# Patient Record
Sex: Female | Born: 1950 | Race: White | Hispanic: No | Marital: Married | Smoking: Former smoker
Health system: Southern US, Community
[De-identification: ages and names within clinical notes are randomized; demographics above are authoritative.]

## PROBLEM LIST (undated history)

## (undated) DIAGNOSIS — I639 Cerebral infarction, unspecified: Secondary | ICD-10-CM

## (undated) DIAGNOSIS — I10 Essential (primary) hypertension: Secondary | ICD-10-CM

## (undated) HISTORY — DX: Cerebral infarction, unspecified: I63.9

## (undated) HISTORY — PX: TONSILLECTOMY: SUR1361

## (undated) HISTORY — PX: BREAST BIOPSY: SHX20

## (undated) HISTORY — DX: Essential (primary) hypertension: I10

## (undated) HISTORY — PX: BREAST EXCISIONAL BIOPSY: SUR124

---

## 1996-07-19 HISTORY — PX: THYROIDECTOMY: SHX17

## 1998-09-11 ENCOUNTER — Other Ambulatory Visit: Admission: RE | Admit: 1998-09-11 | Discharge: 1998-09-11 | Payer: Self-pay | Admitting: *Deleted

## 1999-05-14 ENCOUNTER — Encounter: Payer: Self-pay | Admitting: *Deleted

## 1999-05-14 ENCOUNTER — Encounter: Admission: RE | Admit: 1999-05-14 | Discharge: 1999-05-14 | Payer: Self-pay | Admitting: *Deleted

## 1999-11-11 ENCOUNTER — Other Ambulatory Visit: Admission: RE | Admit: 1999-11-11 | Discharge: 1999-11-11 | Payer: Self-pay | Admitting: *Deleted

## 1999-11-26 ENCOUNTER — Encounter: Payer: Self-pay | Admitting: *Deleted

## 1999-11-26 ENCOUNTER — Encounter: Admission: RE | Admit: 1999-11-26 | Discharge: 1999-11-26 | Payer: Self-pay | Admitting: *Deleted

## 2000-11-14 ENCOUNTER — Other Ambulatory Visit: Admission: RE | Admit: 2000-11-14 | Discharge: 2000-11-14 | Payer: Self-pay | Admitting: *Deleted

## 2000-11-17 ENCOUNTER — Encounter: Payer: Self-pay | Admitting: *Deleted

## 2000-11-17 ENCOUNTER — Encounter: Admission: RE | Admit: 2000-11-17 | Discharge: 2000-11-17 | Payer: Self-pay | Admitting: *Deleted

## 2001-11-14 ENCOUNTER — Other Ambulatory Visit: Admission: RE | Admit: 2001-11-14 | Discharge: 2001-11-14 | Payer: Self-pay | Admitting: *Deleted

## 2001-11-20 ENCOUNTER — Encounter: Payer: Self-pay | Admitting: Endocrinology

## 2001-11-20 ENCOUNTER — Encounter: Admission: RE | Admit: 2001-11-20 | Discharge: 2001-11-20 | Payer: Self-pay | Admitting: Endocrinology

## 2002-11-27 ENCOUNTER — Encounter: Payer: Self-pay | Admitting: Obstetrics and Gynecology

## 2002-11-27 ENCOUNTER — Encounter: Admission: RE | Admit: 2002-11-27 | Discharge: 2002-11-27 | Payer: Self-pay | Admitting: Obstetrics and Gynecology

## 2002-11-27 ENCOUNTER — Other Ambulatory Visit: Admission: RE | Admit: 2002-11-27 | Discharge: 2002-11-27 | Payer: Self-pay | Admitting: Obstetrics and Gynecology

## 2003-12-02 ENCOUNTER — Other Ambulatory Visit: Admission: RE | Admit: 2003-12-02 | Discharge: 2003-12-02 | Payer: Self-pay | Admitting: Obstetrics and Gynecology

## 2003-12-11 ENCOUNTER — Encounter: Admission: RE | Admit: 2003-12-11 | Discharge: 2003-12-11 | Payer: Self-pay | Admitting: Obstetrics and Gynecology

## 2003-12-11 ENCOUNTER — Encounter (INDEPENDENT_AMBULATORY_CARE_PROVIDER_SITE_OTHER): Payer: Self-pay | Admitting: Specialist

## 2003-12-25 ENCOUNTER — Encounter (HOSPITAL_COMMUNITY): Admission: RE | Admit: 2003-12-25 | Discharge: 2004-03-24 | Payer: Self-pay | Admitting: Obstetrics and Gynecology

## 2004-12-11 ENCOUNTER — Encounter: Admission: RE | Admit: 2004-12-11 | Discharge: 2004-12-11 | Payer: Self-pay | Admitting: Obstetrics and Gynecology

## 2005-12-15 ENCOUNTER — Encounter: Admission: RE | Admit: 2005-12-15 | Discharge: 2005-12-15 | Payer: Self-pay | Admitting: Obstetrics and Gynecology

## 2007-02-06 ENCOUNTER — Encounter: Admission: RE | Admit: 2007-02-06 | Discharge: 2007-02-06 | Payer: Self-pay | Admitting: Obstetrics and Gynecology

## 2007-02-14 ENCOUNTER — Encounter: Admission: RE | Admit: 2007-02-14 | Discharge: 2007-02-14 | Payer: Self-pay | Admitting: Obstetrics and Gynecology

## 2007-05-24 ENCOUNTER — Ambulatory Visit (HOSPITAL_COMMUNITY): Admission: RE | Admit: 2007-05-24 | Discharge: 2007-05-24 | Payer: Self-pay | Admitting: Endocrinology

## 2008-05-01 ENCOUNTER — Encounter: Admission: RE | Admit: 2008-05-01 | Discharge: 2008-05-01 | Payer: Self-pay | Admitting: Obstetrics and Gynecology

## 2009-04-25 ENCOUNTER — Encounter (HOSPITAL_COMMUNITY): Admission: RE | Admit: 2009-04-25 | Discharge: 2009-06-20 | Payer: Self-pay | Admitting: Endocrinology

## 2009-06-16 ENCOUNTER — Encounter: Admission: RE | Admit: 2009-06-16 | Discharge: 2009-06-16 | Payer: Self-pay | Admitting: Obstetrics and Gynecology

## 2009-10-02 ENCOUNTER — Encounter: Admission: RE | Admit: 2009-10-02 | Discharge: 2009-10-02 | Payer: Self-pay | Admitting: Endocrinology

## 2010-08-09 ENCOUNTER — Encounter: Payer: Self-pay | Admitting: Obstetrics and Gynecology

## 2010-09-17 ENCOUNTER — Other Ambulatory Visit: Payer: Self-pay | Admitting: Obstetrics & Gynecology

## 2010-09-17 DIAGNOSIS — Z09 Encounter for follow-up examination after completed treatment for conditions other than malignant neoplasm: Secondary | ICD-10-CM

## 2010-09-24 ENCOUNTER — Other Ambulatory Visit: Payer: Self-pay | Admitting: Obstetrics & Gynecology

## 2010-09-24 ENCOUNTER — Ambulatory Visit
Admission: RE | Admit: 2010-09-24 | Discharge: 2010-09-24 | Disposition: A | Discharge status: Home / Self Care | Payer: BC Managed Care – PPO | Source: Ambulatory Visit | Attending: Obstetrics & Gynecology | Admitting: Obstetrics & Gynecology

## 2010-09-24 DIAGNOSIS — Z09 Encounter for follow-up examination after completed treatment for conditions other than malignant neoplasm: Secondary | ICD-10-CM

## 2011-11-11 ENCOUNTER — Other Ambulatory Visit: Payer: Self-pay | Admitting: Obstetrics & Gynecology

## 2011-11-11 DIAGNOSIS — Z1231 Encounter for screening mammogram for malignant neoplasm of breast: Secondary | ICD-10-CM

## 2011-11-16 ENCOUNTER — Ambulatory Visit
Admission: RE | Admit: 2011-11-16 | Discharge: 2011-11-16 | Disposition: A | Discharge status: Home / Self Care | Payer: BC Managed Care – PPO | Source: Ambulatory Visit | Attending: Obstetrics & Gynecology | Admitting: Obstetrics & Gynecology

## 2011-11-16 DIAGNOSIS — Z1231 Encounter for screening mammogram for malignant neoplasm of breast: Secondary | ICD-10-CM

## 2013-03-28 ENCOUNTER — Other Ambulatory Visit: Payer: Self-pay

## 2013-03-28 DIAGNOSIS — Z1231 Encounter for screening mammogram for malignant neoplasm of breast: Secondary | ICD-10-CM

## 2013-03-29 ENCOUNTER — Ambulatory Visit
Admission: RE | Admit: 2013-03-29 | Discharge: 2013-03-29 | Disposition: A | Discharge status: Home / Self Care | Payer: BC Managed Care – PPO | Source: Ambulatory Visit

## 2013-03-29 DIAGNOSIS — Z1231 Encounter for screening mammogram for malignant neoplasm of breast: Secondary | ICD-10-CM

## 2013-04-02 ENCOUNTER — Other Ambulatory Visit: Payer: Self-pay | Admitting: Endocrinology

## 2013-04-02 DIAGNOSIS — R928 Other abnormal and inconclusive findings on diagnostic imaging of breast: Secondary | ICD-10-CM

## 2013-04-10 ENCOUNTER — Other Ambulatory Visit: Payer: Self-pay | Admitting: Gastroenterology

## 2013-04-17 ENCOUNTER — Ambulatory Visit
Admission: RE | Admit: 2013-04-17 | Discharge: 2013-04-17 | Disposition: A | Discharge status: Home / Self Care | Payer: BC Managed Care – PPO | Source: Ambulatory Visit | Attending: Endocrinology | Admitting: Endocrinology

## 2013-04-17 DIAGNOSIS — R928 Other abnormal and inconclusive findings on diagnostic imaging of breast: Secondary | ICD-10-CM

## 2015-03-03 ENCOUNTER — Other Ambulatory Visit: Payer: Self-pay

## 2015-03-03 DIAGNOSIS — Z1231 Encounter for screening mammogram for malignant neoplasm of breast: Secondary | ICD-10-CM

## 2015-04-17 ENCOUNTER — Ambulatory Visit: Payer: Self-pay

## 2015-05-07 ENCOUNTER — Ambulatory Visit: Payer: Self-pay

## 2015-06-10 ENCOUNTER — Ambulatory Visit
Admission: RE | Admit: 2015-06-10 | Discharge: 2015-06-10 | Disposition: A | Discharge status: Home / Self Care | Payer: PPO | Source: Ambulatory Visit

## 2015-06-10 DIAGNOSIS — Z1231 Encounter for screening mammogram for malignant neoplasm of breast: Secondary | ICD-10-CM

## 2015-08-19 ENCOUNTER — Other Ambulatory Visit (HOSPITAL_COMMUNITY): Payer: Self-pay | Admitting: Endocrinology

## 2015-08-19 DIAGNOSIS — R42 Dizziness and giddiness: Secondary | ICD-10-CM | POA: Diagnosis not present

## 2015-08-19 DIAGNOSIS — G459 Transient cerebral ischemic attack, unspecified: Secondary | ICD-10-CM

## 2015-08-19 DIAGNOSIS — R930 Abnormal findings on diagnostic imaging of skull and head, not elsewhere classified: Secondary | ICD-10-CM | POA: Diagnosis not present

## 2015-08-19 DIAGNOSIS — H539 Unspecified visual disturbance: Secondary | ICD-10-CM | POA: Diagnosis not present

## 2015-08-19 DIAGNOSIS — E890 Postprocedural hypothyroidism: Secondary | ICD-10-CM | POA: Diagnosis not present

## 2015-08-19 DIAGNOSIS — I639 Cerebral infarction, unspecified: Secondary | ICD-10-CM | POA: Diagnosis not present

## 2015-08-19 DIAGNOSIS — I10 Essential (primary) hypertension: Secondary | ICD-10-CM | POA: Diagnosis not present

## 2015-08-19 DIAGNOSIS — R531 Weakness: Secondary | ICD-10-CM | POA: Diagnosis not present

## 2015-08-21 ENCOUNTER — Ambulatory Visit (HOSPITAL_COMMUNITY)
Admission: RE | Admit: 2015-08-21 | Discharge: 2015-08-21 | Disposition: A | Discharge status: Home / Self Care | Payer: PPO | Source: Ambulatory Visit | Attending: Vascular Surgery | Admitting: Vascular Surgery

## 2015-08-21 ENCOUNTER — Other Ambulatory Visit (HOSPITAL_COMMUNITY): Payer: Self-pay | Admitting: Family Medicine

## 2015-08-21 DIAGNOSIS — H539 Unspecified visual disturbance: Secondary | ICD-10-CM

## 2015-08-21 DIAGNOSIS — I6312 Cerebral infarction due to embolism of basilar artery: Secondary | ICD-10-CM | POA: Diagnosis not present

## 2015-08-21 DIAGNOSIS — G459 Transient cerebral ischemic attack, unspecified: Secondary | ICD-10-CM | POA: Insufficient documentation

## 2015-08-21 DIAGNOSIS — I10 Essential (primary) hypertension: Secondary | ICD-10-CM | POA: Insufficient documentation

## 2015-08-21 DIAGNOSIS — Z6822 Body mass index (BMI) 22.0-22.9, adult: Secondary | ICD-10-CM | POA: Diagnosis not present

## 2015-08-21 DIAGNOSIS — R29898 Other symptoms and signs involving the musculoskeletal system: Secondary | ICD-10-CM

## 2015-08-22 ENCOUNTER — Other Ambulatory Visit: Payer: Self-pay

## 2015-08-22 ENCOUNTER — Encounter (INDEPENDENT_AMBULATORY_CARE_PROVIDER_SITE_OTHER): Payer: Self-pay

## 2015-08-22 ENCOUNTER — Ambulatory Visit (HOSPITAL_COMMUNITY): Payer: PPO | Attending: Cardiology

## 2015-08-22 DIAGNOSIS — I517 Cardiomegaly: Secondary | ICD-10-CM | POA: Insufficient documentation

## 2015-08-22 DIAGNOSIS — G459 Transient cerebral ischemic attack, unspecified: Secondary | ICD-10-CM

## 2015-08-22 DIAGNOSIS — I340 Nonrheumatic mitral (valve) insufficiency: Secondary | ICD-10-CM | POA: Diagnosis not present

## 2015-08-22 DIAGNOSIS — I10 Essential (primary) hypertension: Secondary | ICD-10-CM | POA: Diagnosis not present

## 2015-08-22 DIAGNOSIS — I313 Pericardial effusion (noninflammatory): Secondary | ICD-10-CM | POA: Insufficient documentation

## 2015-08-25 DIAGNOSIS — I6349 Cerebral infarction due to embolism of other cerebral artery: Secondary | ICD-10-CM | POA: Diagnosis not present

## 2015-08-25 DIAGNOSIS — H547 Unspecified visual loss: Secondary | ICD-10-CM | POA: Diagnosis not present

## 2015-08-27 DIAGNOSIS — I10 Essential (primary) hypertension: Secondary | ICD-10-CM | POA: Diagnosis not present

## 2015-08-28 DIAGNOSIS — I6349 Cerebral infarction due to embolism of other cerebral artery: Secondary | ICD-10-CM | POA: Diagnosis not present

## 2015-08-28 DIAGNOSIS — H547 Unspecified visual loss: Secondary | ICD-10-CM | POA: Diagnosis not present

## 2015-09-01 ENCOUNTER — Ambulatory Visit (INDEPENDENT_AMBULATORY_CARE_PROVIDER_SITE_OTHER): Payer: PPO | Admitting: Diagnostic Neuroimaging

## 2015-09-01 ENCOUNTER — Encounter: Payer: Self-pay | Admitting: Diagnostic Neuroimaging

## 2015-09-01 VITALS — BP 135/79 | HR 74 | Ht 67.0 in | Wt 140.6 lb

## 2015-09-01 DIAGNOSIS — R42 Dizziness and giddiness: Secondary | ICD-10-CM | POA: Diagnosis not present

## 2015-09-01 DIAGNOSIS — R030 Elevated blood-pressure reading, without diagnosis of hypertension: Secondary | ICD-10-CM | POA: Diagnosis not present

## 2015-09-01 DIAGNOSIS — R5383 Other fatigue: Secondary | ICD-10-CM | POA: Diagnosis not present

## 2015-09-01 DIAGNOSIS — I639 Cerebral infarction, unspecified: Secondary | ICD-10-CM

## 2015-09-01 DIAGNOSIS — R531 Weakness: Secondary | ICD-10-CM | POA: Diagnosis not present

## 2015-09-01 DIAGNOSIS — I10 Essential (primary) hypertension: Secondary | ICD-10-CM | POA: Diagnosis not present

## 2015-09-01 NOTE — Progress Notes (Signed)
[facility]  PATIENT: Bethany Ross DOB: 15-Nov-1950  REFERRING CLINICIAN: Forde Dandy, S  HISTORY FROM: patient  REASON FOR VISIT: new consult    HISTORICAL  CHIEF COMPLAINT:  Chief Complaint  Patient presents with  . Cerebral infarction    rm 6, New Patient    HISTORY OF PRESENT ILLNESS:   65 year old right-handed female with hypertension, here for evaluation of stroke.  08/11/15 patient noticed blurred vision from her left eye towards the left side. On 08/18/15 patient had numbness of left leg. Patient went to PCP for evaluation, had MRI of the brain ordered, which showed a right occipital polar ischemic infarction. Patient referred to me for further evaluation.  In retrospect patient had another episode of lightheadedness and left leg numbness in December 2016, transient, which she did not seek medical attention for.  Patient still has some mild vision difficulty. Her left-sided numbness and weakness has improved.   REVIEW OF SYSTEMS: Full 14 system review of systems performed and notable only for fatigue blurred vision feeling cold memory loss sleepiness restless legs decreased energy. History of thyroid removal 1998.  ALLERGIES: No Known Allergies  HOME MEDICATIONS: No outpatient prescriptions prior to visit.   No facility-administered medications prior to visit.    PAST MEDICAL HISTORY: Past Medical History  Diagnosis Date  . Cerebral infarction [facility])     PAST SURGICAL HISTORY: Past Surgical History  Procedure Laterality Date  . Thyroidectomy  1998  . Tonsillectomy      as child    FAMILY HISTORY: Family History  Problem Relation Age of Onset  . Cancer - Cervical Mother   . Cancer Father     esophageal    SOCIAL HISTORY:  Social History   Social History  . Marital Status: Married    Spouse Name: N/A  . Number of Children: 0  . Years of Education: 12   Occupational History  .      retired   Social History Main Topics    . Smoking status: Former Smoker    Quit date: 08/31/1996  . Smokeless tobacco: Not on file  . Alcohol Use: Yes     Comment: 4 glasses wine/weekly  . Drug Use: No  . Sexual Activity: Not on file   Other Topics Concern  . Not on file   Social History Narrative   Lives at home with husband   Caffeine use- very little     PHYSICAL EXAM  GENERAL EXAM/CONSTITUTIONAL: Vitals:  Filed Vitals:   09/01/15 1047  BP: 135/79  Pulse: 74  Height: 5\' 7"  (1.702 m)  Weight: 140 lb 9.6 oz (63.776 kg)     Body mass index is 22.02 kg/(m^2).  Visual Acuity Screening   Right eye Left eye Both eyes  Without correction: 20/20 20/30   With correction:        Patient is in no distress; well developed, nourished and groomed; neck is supple  CARDIOVASCULAR:  Examination of carotid arteries is normal; no carotid bruits  Regular rate and rhythm, no murmurs  Examination of peripheral vascular system by observation and palpation is normal  EYES:  Ophthalmoscopic exam of optic discs and posterior segments is normal; no papilledema or hemorrhages  MUSCULOSKELETAL:  Gait, strength, tone, movements noted in Neurologic exam below  NEUROLOGIC: MENTAL STATUS:  No flowsheet data found.  awake, alert, oriented to person, place and time  recent and remote memory intact  normal attention and concentration  language fluent, comprehension intact, naming intact,  fund of knowledge appropriate  CRANIAL NERVE:   2nd - no papilledema on fundoscopic exam  2nd, 3rd, 4th, 6th - pupils equal and reactive to light, visual fields full to confrontation, extraocular muscles intact, no nystagmus  5th - facial sensation symmetric  7th - facial strength symmetric  8th - hearing intact  9th - palate elevates symmetrically, uvula midline  11th - shoulder shrug symmetric  12th - tongue protrusion midline  MOTOR:   normal bulk and tone, full strength in the BUE, BLE  SENSORY:   normal  and symmetric to light touch, temperature, vibration  COORDINATION:   finger-nose-finger, fine finger movements normal  REFLEXES:   deep tendon reflexes present and symmetric  GAIT/STATION:   narrow based gait; able to walk tandem; romberg is negative    DIAGNOSTIC DATA (LABS, IMAGING, TESTING) - I reviewed patient records, labs, notes, testing and imaging myself where available.  No results found for: WBC, HGB, HCT, MCV, PLT No results found for: NA, K, CL, CO2, GLUCOSE, BUN, CREATININE, CALCIUM, PROT, ALBUMIN, AST, ALT, ALKPHOS, BILITOT, GFRNONAA, GFRAA No results found for: CHOL, HDL, LDLCALC, LDLDIRECT, TRIG, CHOLHDL No results found for: HGBA1C No results found for: VITAMINB12 No results found for: TSH  08/19/15 MRI brain [I reviewed images myself and agree with interpretation. -[facility]]  - acute-subacute right occipital pole stroke - mild chronic small vessel ischemic disease   08/21/15 Carotid u/s  - no stenosis  08/21/15 TTE - Left ventricle: The cavity size was normal. There was mild focal basal hypertrophy of the septum. Systolic function was normal. The estimated ejection fraction was in the range of 60% to 65%. Doppler parameters are consistent with abnormal left ventricular relaxation (grade 1 diastolic dysfunction). - Mitral valve: There was mild regurgitation. - Pericardium, extracardiac: A trivial pericardial effusion was identified posterior to the heart.    ASSESSMENT AND PLAN  65 y.o. year old female here with right occipital ischemic infarction, which appears to be embolic based on imaging characteristics. No embolic source found. Will check MRA head, cardiac outpatient monitoring, sleep study. If significant symptomatic intracranial stenosis is found then will use aspirin plus Plavix for 3 months and then reduced to single antiplatelet agent. If cardiac monitor for 30 days is negative may consider implantable loop recorder. Also could consider  research study.  Dx:  1. Occipital stroke (HCC)      PLAN: - continue aspirin and BP med - follow up with PCP re: diabetes and lipid screening/treatment - check MRA head, sleep study and cardiac monitor - consider RESPECT ESUS study (for cryptogenic stroke) - stroke warning signs and education reviewed  Orders Placed This Encounter  Procedures  . MR MRA HEAD WO CONTRAST  . Ambulatory referral to Sleep Studies  . Cardiac event monitor   Return in about 3 months (around 11/29/2015).  I reviewed images, labs, notes, records myself. I summarized findings and reviewed with patient, for this high risk condition (stroke) requiring high complexity decision making.    Penni Bombard, MD [id number], [id number] AM Certified in Neurology, Neurophysiology and Neuroimaging  [facility] [address] [place], [place] [address] [phone]

## 2015-09-01 NOTE — Patient Instructions (Addendum)
Thank you for coming to see [place] at [facility]. I hope we have been able to provide you high quality care today.  You may receive a patient satisfaction survey over the next few weeks. We would appreciate your feedback and comments so that we may continue to improve ourselves and the health of our patients.  - continue aspirin 61m daily and BP med - I will check MRA head, heart monitor and sleep study testing    ~~~~~~~~~~~~~~~~~~~~~~~~~~~~~~~~~~~~~~~~~~~~~~~~~~~~~~~~~~~~~~~~~  DR. Treyvion Durkee'S GUIDE TO HAPPY AND HEALTHY LIVING These are some of my general health and wellness recommendations. Some of them may apply to you better than others. Please use common sense as you try these suggestions and feel free to ask me any questions.   ACTIVITY/FITNESS Mental, social, emotional and physical stimulation are very important for brain and body health. Try learning a new activity (arts, music, language, sports, games).  Keep moving your body to the best of your abilities. You can do this at home, inside or outside, the park, community center, gym or anywhere you like. Consider a physical therapist or personal trainer to get started. Consider the app Sworkit. Fitness trackers such as smart-watches, smart-phones or Fitbits can help as well.   NUTRITION Eat more plants: colorful vegetables, nuts, seeds and berries.  Eat less sugar, salt, preservatives and processed foods.  Avoid toxins such as cigarettes and alcohol.  Drink water when you are thirsty. Warm water with a slice of lemon is an excellent morning drink to start the day.  Consider these websites for more information The Nutrition Source (hhttps://www.henry-hernandez.biz/ [employer] Nutrition (wWindowBlog.ch   RELAXATION Consider practicing mindfulness meditation or other relaxation techniques such as deep breathing, prayer, yoga, tai chi, massage. See website mindful.org  or the apps Headspace or Calm to help get started.   SLEEP Try to get at least 7-8+ hours sleep per day. Regular exercise and reduced caffeine will help you sleep better. Practice good sleep hygeine techniques. See website sleep.org for more information.   PLANNING Prepare estate planning, living will, healthcare POA documents. Sometimes this is best planned with the help of an attorney. Theconversationproject.org and agingwithdignity.org are excellent resources.

## 2015-09-05 DIAGNOSIS — I6312 Cerebral infarction due to embolism of basilar artery: Secondary | ICD-10-CM | POA: Diagnosis not present

## 2015-09-05 DIAGNOSIS — Z6822 Body mass index (BMI) 22.0-22.9, adult: Secondary | ICD-10-CM | POA: Diagnosis not present

## 2015-09-05 DIAGNOSIS — I10 Essential (primary) hypertension: Secondary | ICD-10-CM | POA: Diagnosis not present

## 2015-09-11 ENCOUNTER — Ambulatory Visit (INDEPENDENT_AMBULATORY_CARE_PROVIDER_SITE_OTHER): Payer: PPO | Admitting: Neurology

## 2015-09-11 ENCOUNTER — Encounter: Payer: Self-pay | Admitting: Neurology

## 2015-09-11 VITALS — BP 136/78 | HR 72 | Resp 16 | Ht 67.0 in | Wt 139.0 lb

## 2015-09-11 DIAGNOSIS — G479 Sleep disorder, unspecified: Secondary | ICD-10-CM | POA: Diagnosis not present

## 2015-09-11 DIAGNOSIS — I639 Cerebral infarction, unspecified: Secondary | ICD-10-CM

## 2015-09-11 DIAGNOSIS — R0683 Snoring: Secondary | ICD-10-CM

## 2015-09-11 DIAGNOSIS — G2581 Restless legs syndrome: Secondary | ICD-10-CM | POA: Diagnosis not present

## 2015-09-11 DIAGNOSIS — G478 Other sleep disorders: Secondary | ICD-10-CM | POA: Diagnosis not present

## 2015-09-11 NOTE — Patient Instructions (Signed)

## 2015-09-11 NOTE — Progress Notes (Signed)
Subjective:    Patient ID: Bethany Ross is a 65 y.o. female.  HPI     Star Age, MD, PhD [facility] [address], [address]O. [place], [place] [address]  Dear Bethany Ross,   I saw your patient, Bethany Ross, upon your kind request in my clinic today for initial consultation of her sleep disorder, in particular, concern for underlying obstructive sleep apnea in the context of recent history of stroke. The patient is unaccompanied today. As you know, Bethany Ross is a 65 year old right-handed woman with an underlying medical history of hypothyroidism, right occipital ischemic stroke in January 2017, with residual visual symptoms reported, status post thyroidectomy (1998 and one parathyroid gland), status post tonsillectomy (as a child), who reports snoring and excessive daytime somnolence. Her main complaint lately with her sleep is that she does not wake up rested. She wakes up in the middle of the night and has had trouble going back to sleep. Generally, she is in bed between 9 and 9:30 PM. Her rise time is between 5 and 5:30 AM. She does not reported recurrent morning headaches, or nocturia. She has had restless leg symptoms off and on. She is a restless sleeper. She tosses and turns a lot and switches positions, she typically sleeps on her sides. She denies a family history of sleep apnea. However, she reports loud snoring and her husband and apneic pauses while he is asleep and would like for him to be tested as well. She is scheduled for a brain MRA soon but has not heard back about the 30 day heart monitor yet. Her Epworth sleepiness score is 5 out of 24 today, her fatigue score is 36 out of 63. She lives at home with her husband. She does not work. 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 They have no children. She quit smoking many years ago, she does not currently drink any caffeine on a day-to-day basis. She drinks alcohol  in the form of wine, usually 4 nights out of the week. She has seen ophthalmology locally in [place]. She has some residual blurry vision and a mild decrease in visual field.   I reviewed your office note from 09/01/2015.  Her Past Medical History Is Significant For: Past Medical History  Diagnosis Date  . Cerebral infarction ([place])   . Hypertension     Her Past Surgical History Is Significant For: Past Surgical History  Procedure Laterality Date  . Thyroidectomy  1998  . Tonsillectomy      as child    Her Family History Is Significant For: Family History  Problem Relation Age of Onset  . Cancer - Cervical Mother   . Cancer Father     esophageal    Her Social History Is Significant For: Social History   Social History  . Marital Status: Married    Spouse Name: N/A  . Number of Children: 0  . Years of Education: 12   Occupational History  . N/a     retired   Social History Main Topics  . Smoking status: Former Smoker    Quit date: 08/31/1996  . Smokeless tobacco: None  . Alcohol Use: 0.0 oz/week    0 Standard drinks or equivalent per week     Comment: 4 glasses wine/weekly  . Drug Use: No  . Sexual Activity: Not Asked   Other Topics Concern  . None   Social History Narrative   Lives at home  with husband   Caffeine use- very little    Her Allergies Are:  No Known Allergies:   Her Current Medications Are:  Outpatient Encounter Prescriptions as of 09/11/2015  Medication Sig  . amLODipine (NORVASC) 2.5 MG tablet TK 1 T PO D PRN  . aspirin 81 MG tablet Take 81 mg by mouth daily.  Marland Kitchen levothyroxine (SYNTHROID, LEVOTHROID) 112 MCG tablet Take 112 mcg by mouth daily before breakfast.  . PRESCRIPTION MEDICATION 40 mg daily. 09/01/15 BP med, name unknown to patient today   No facility-administered encounter medications on file as of 09/11/2015.  :  Review of Systems:  Out of a complete 14 point review of systems, all are reviewed and negative with the  exception of these symptoms as listed below:   Review of Systems  Constitutional: Positive for fatigue.  Eyes:       Blurred vision   Endocrine:       Feeling cold  Neurological:       Restless legs, patient reports trouble falling and staying asleep, snores when sleeping on back, sometimes wakes up feeling tired, daytime tiredness.   Psychiatric/Behavioral:       Not enough sleep, decreased energy    Epworth Sleepiness Scale 0= would never doze 1= slight chance of dozing 2= moderate chance of dozing 3= high chance of dozing  Sitting and reading:1 Watching TV:1 Sitting inactive in a public place (ex. Theater or meeting)0 As a passenger in a car for an hour without a break:1 Lying down to rest in the afternoon:1 Sitting and talking to someone:0 Sitting quietly after lunch (no alcohol):1 In a car, while stopped in traffic:0 Total:5  Objective:  Neurologic Exam  Physical Exam Physical Examination:   There were no vitals filed for this visit.  General Examination: The patient is a very pleasant 65 y.o. female in no acute distress. She appears well-developed and well-nourished and well groomed.   HEENT: Normocephalic, atraumatic, pupils are equal, round and reactive to light and accommodation. Funduscopic exam is normal with sharp disc margins noted. Extraocular tracking is good without limitation to gaze excursion or nystagmus noted. Normal smooth pursuit is noted. Hearing is grossly intact. Tympanic membranes are clear bilaterally. Face is symmetric with normal facial animation and normal facial sensation. Speech is clear with no dysarthria noted. There is no hypophonia. There is no lip, neck/head, jaw or voice tremor. Neck is supple with full range of passive and active motion. There are no carotid bruits on auscultation. Oropharynx exam reveals: mild mouth dryness, good dental hygiene and mild airway crowding, due to  smaller airway entry. Tonsils are absent. Uvula is actually  small, tongue is normal in size. Mallampati is class I. Tongue protrudes centrally and palate elevates symmetrically. Neck size is 13 1/8 inches. She has a mild overbite.    Chest: Clear to auscultation without wheezing, rhonchi or crackles noted.  Heart: S1+S2+0, regular and normal without murmurs, rubs or gallops noted.   Abdomen: Soft, non-tender and non-distended with normal bowel sounds appreciated on auscultation.  Extremities: There is no pitting edema in the distal lower extremities bilaterally. Pedal pulses are intact.  Skin: Warm and dry without trophic changes noted.  Musculoskeletal: exam reveals no obvious joint deformities, tenderness or joint swelling or erythema.   Neurologically:  Mental status: The patient is awake, alert and oriented in all 4 spheres. Her immediate and remote memory, attention, language skills and fund of knowledge are appropriate. There is no evidence of aphasia, agnosia, apraxia  or anomia. Speech is clear with normal prosody and enunciation. Thought process is linear. Mood is normal and affect is normal.  Cranial nerves II - XII are as described above under HEENT exam. In addition: shoulder shrug is normal with equal shoulder height noted. Motor exam: Normal bulk, strength and tone is noted. There is no drift, tremor or rebound. Romberg is negative, except for minimal swaying. Reflexes are 2+ throughout and mildly brisker in both knees, with cross adductor response bilaterally. Babinski: Toes are flexor bilaterally. Fine motor skills and coordination: intact with normal finger taps, normal hand movements, normal rapid alternating patting, normal foot taps and normal foot agility.  Cerebellar testing: No dysmetria or intention tremor on finger to nose testing. Heel to shin is unremarkable bilaterally. There is no truncal or gait ataxia.  Sensory exam: intact to light touch, pinprick, vibration, temperature sense in the upper and lower extremities.  Gait,  station and balance: She stands easily. No veering to one side is noted. No leaning to one side is noted. Posture is age-appropriate and stance is narrow based. Gait shows normal stride length and normal pace. No problems turning are noted. She turns en bloc. Tandem walk is unremarkable.   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 I had a long chat with the patient about my findings and the diagnosis of OSA, its prognosis and treatment options. We talked about medical treatments, surgical interventions and non-pharmacological approaches. I explained in particular the risks and ramifications of untreated moderate to severe OSA, especially with respect to developing cardiovascular disease down the Road, including congestive heart failure, difficult to treat hypertension, cardiac arrhythmias, or stroke. Even type 2 diabetes has, in part, been linked to untreated OSA. Symptoms of untreated OSA include daytime sleepiness, memory problems, mood irritability and mood disorder such as depression and anxiety, lack of energy, as well as recurrent headaches, especially morning headaches. We talked about trying to maintain a healthy lifestyle in general, as well as the importance of weight control. I encouraged the patient to eat healthy, exercise daily and keep well hydrated, to keep a scheduled bedtime and wake time routine, to not skip any meals and eat healthy snacks in between meals. I advised the patient not to drive when feeling sleepy. I recommended the following at this time: sleep study with potential positive airway  pressure titration. (We will score hypopneas at 4% and split the sleep study into diagnostic and treatment portion, if the estimated. 2 hour AHI is >15/h). She does live an hour away. She is not uncomfortable driving herself but what with an hours' drive to our sleep lab, she would like to see if her husband can be scheduled at the same time. She would like for him to be evaluated in our clinic as well and he will see his primary care physician this week or next week as I understand. We can certainly try to see if both the patient and her husband can be scheduled at the same time. I think it would make sense.  I explained the sleep test procedure to the patient and also outlined possible surgical and non-surgical treatment  options of OSA, including the use of a custom-made dental device (which would require a referral to a specialist dentist or oral surgeon), upper airway surgical options, such as pillar implants, radiofrequency surgery, tongue base surgery, and UPPP (which would involve a referral to an ENT surgeon). Rarely, jaw surgery such as mandibular advancement may be considered.  I also explained the CPAP treatment option to the patient, who indicated that she would be willing to try CPAP if the need arises. I explained the importance of being compliant with PAP treatment, not only for insurance purposes but primarily to improve Her symptoms, and for the patient's long term health benefit, including to reduce Her cardiovascular risks. I answered all her questions today and the patient was in agreement. I would like to see her back after the sleep study is completed and encouraged her to call with any interim questions, concerns, problems or updates.   Thank you very much for allowing me to participate in the care of this nice patient. If I can be of any further assistance to you please do not hesitate to talk to me.   Sincerely,   Star Age, MD, PhD

## 2015-09-16 ENCOUNTER — Telehealth: Payer: Self-pay | Admitting: Neurology

## 2015-09-16 DIAGNOSIS — I639 Cerebral infarction, unspecified: Secondary | ICD-10-CM | POA: Diagnosis not present

## 2015-09-16 DIAGNOSIS — R42 Dizziness and giddiness: Secondary | ICD-10-CM | POA: Diagnosis not present

## 2015-09-16 NOTE — Telephone Encounter (Signed)
I called the patient. 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 There appears be some aphthous chronic changes in the mid and distal small vessels in both anterior and posterior circulation consistent with atherosclerosis.

## 2015-09-22 DIAGNOSIS — I10 Essential (primary) hypertension: Secondary | ICD-10-CM | POA: Diagnosis not present

## 2015-09-22 DIAGNOSIS — Z6839 Body mass index (BMI) 39.0-39.9, adult: Secondary | ICD-10-CM | POA: Diagnosis not present

## 2015-09-22 DIAGNOSIS — E784 Other hyperlipidemia: Secondary | ICD-10-CM | POA: Diagnosis not present

## 2015-09-22 DIAGNOSIS — I6312 Cerebral infarction due to embolism of basilar artery: Secondary | ICD-10-CM | POA: Diagnosis not present

## 2015-10-22 DIAGNOSIS — Z6823 Body mass index (BMI) 23.0-23.9, adult: Secondary | ICD-10-CM | POA: Diagnosis not present

## 2015-10-22 DIAGNOSIS — E784 Other hyperlipidemia: Secondary | ICD-10-CM | POA: Diagnosis not present

## 2015-10-22 DIAGNOSIS — E890 Postprocedural hypothyroidism: Secondary | ICD-10-CM | POA: Diagnosis not present

## 2015-10-22 DIAGNOSIS — I10 Essential (primary) hypertension: Secondary | ICD-10-CM | POA: Diagnosis not present

## 2015-10-22 DIAGNOSIS — I6312 Cerebral infarction due to embolism of basilar artery: Secondary | ICD-10-CM | POA: Diagnosis not present

## 2015-10-22 DIAGNOSIS — R42 Dizziness and giddiness: Secondary | ICD-10-CM | POA: Diagnosis not present

## 2015-11-05 ENCOUNTER — Encounter: Payer: Self-pay | Admitting: Diagnostic Neuroimaging

## 2015-11-11 ENCOUNTER — Ambulatory Visit (INDEPENDENT_AMBULATORY_CARE_PROVIDER_SITE_OTHER): Payer: PPO | Admitting: Neurology

## 2015-11-11 DIAGNOSIS — G4720 Circadian rhythm sleep disorder, unspecified type: Secondary | ICD-10-CM

## 2015-11-11 DIAGNOSIS — G4761 Periodic limb movement disorder: Secondary | ICD-10-CM | POA: Diagnosis not present

## 2015-11-11 DIAGNOSIS — R0683 Snoring: Secondary | ICD-10-CM

## 2015-11-17 ENCOUNTER — Telehealth: Payer: Self-pay | Admitting: Neurology

## 2015-11-17 NOTE — Telephone Encounter (Signed)
Patient referred by Dr. Leta Baptist, seen by me on 09/11/15, diagnostic PSG on 11/11/15.   Please call and notify the patient that the recent sleep study did not show any significant obstructive sleep apnea. She had mild leg kicking/twitching, which can be seen in patients with RLS.  Please inform patient that I would like to go over the details of the study during a follow up appointment. Arrange a followup appointment. Also, route or fax report to PCP and referring MD, if other than PCP.  Once you have spoken to patient, you can close this encounter.   Thanks,  Star Age, MD, PhD [facility])

## 2015-11-17 NOTE — Telephone Encounter (Signed)
I spoke to patient and she is aware of results and recommendations. She states that she has appt with Dr. Leta Baptist on 5/19. She would like to discuss it with him first and if he felt like she still needs f/u, she will make appt.  I have sent a copy of the report to PCP and Dr. Leta Baptist

## 2015-12-03 DIAGNOSIS — Z6821 Body mass index (BMI) 21.0-21.9, adult: Secondary | ICD-10-CM | POA: Diagnosis not present

## 2015-12-03 DIAGNOSIS — E890 Postprocedural hypothyroidism: Secondary | ICD-10-CM | POA: Diagnosis not present

## 2015-12-03 DIAGNOSIS — I10 Essential (primary) hypertension: Secondary | ICD-10-CM | POA: Diagnosis not present

## 2015-12-03 DIAGNOSIS — E559 Vitamin D deficiency, unspecified: Secondary | ICD-10-CM | POA: Diagnosis not present

## 2015-12-03 DIAGNOSIS — I6312 Cerebral infarction due to embolism of basilar artery: Secondary | ICD-10-CM | POA: Diagnosis not present

## 2015-12-03 DIAGNOSIS — G4761 Periodic limb movement disorder: Secondary | ICD-10-CM | POA: Diagnosis not present

## 2015-12-03 DIAGNOSIS — E784 Other hyperlipidemia: Secondary | ICD-10-CM | POA: Diagnosis not present

## 2015-12-03 DIAGNOSIS — G729 Myopathy, unspecified: Secondary | ICD-10-CM | POA: Diagnosis not present

## 2015-12-03 DIAGNOSIS — R5381 Other malaise: Secondary | ICD-10-CM | POA: Diagnosis not present

## 2015-12-03 DIAGNOSIS — G7289 Other specified myopathies: Secondary | ICD-10-CM | POA: Diagnosis not present

## 2015-12-04 ENCOUNTER — Other Ambulatory Visit: Payer: Self-pay | Admitting: Diagnostic Neuroimaging

## 2015-12-04 DIAGNOSIS — I639 Cerebral infarction, unspecified: Secondary | ICD-10-CM

## 2015-12-04 DIAGNOSIS — I4891 Unspecified atrial fibrillation: Secondary | ICD-10-CM

## 2015-12-05 ENCOUNTER — Encounter: Payer: Self-pay | Admitting: Diagnostic Neuroimaging

## 2015-12-05 ENCOUNTER — Ambulatory Visit (INDEPENDENT_AMBULATORY_CARE_PROVIDER_SITE_OTHER): Payer: PPO | Admitting: Diagnostic Neuroimaging

## 2015-12-05 ENCOUNTER — Ambulatory Visit (INDEPENDENT_AMBULATORY_CARE_PROVIDER_SITE_OTHER): Payer: PPO

## 2015-12-05 ENCOUNTER — Telehealth: Payer: Self-pay | Admitting: *Deleted

## 2015-12-05 VITALS — BP 123/74 | HR 80 | Ht 67.0 in | Wt 134.4 lb

## 2015-12-05 DIAGNOSIS — I639 Cerebral infarction, unspecified: Secondary | ICD-10-CM

## 2015-12-05 DIAGNOSIS — G2581 Restless legs syndrome: Secondary | ICD-10-CM

## 2015-12-05 DIAGNOSIS — I4891 Unspecified atrial fibrillation: Secondary | ICD-10-CM | POA: Diagnosis not present

## 2015-12-05 NOTE — Telephone Encounter (Signed)
Bethany Ross             CID [id number]  Patient SAME                 Pt's Dr [facility]    Area Code 336 Phone# [phone] DOC APPT AT [place] ????    Disp:Y/N N If Y = C/B If No Response In 33minutes ============================================================

## 2015-12-05 NOTE — Patient Instructions (Signed)
-   continue aspirin 325mg  daily - continue valsartan for BP control - follow up cardiac monitor

## 2015-12-05 NOTE — Progress Notes (Signed)
[facility]  PATIENT: Bethany PRZYWARA DOB: 10-Nov-1950  REFERRING CLINICIAN: Forde Dandy, S  HISTORY FROM: patient  REASON FOR VISIT: follow up   HISTORICAL  CHIEF COMPLAINT:  Chief Complaint  Patient presents with  . Occipital stroke    rm 6, "fatigue, no energy,  not sleeping well"  . Follow-up    3 month    HISTORY OF PRESENT ILLNESS:   UPDATE 12/05/15: Since last visit, doing well. No new events. Still with fatigue. Left side vision blurriness stable. Still with some fatigue.   PRIOR HPI (09/01/15): 65 year old right-handed female with hypertension, here for evaluation of stroke. 08/11/15 patient noticed blurred vision from her left eye towards the left side. On 08/18/15 patient had numbness of left leg. Patient went to PCP for evaluation, had MRI of the brain ordered, which showed a right occipital polar ischemic infarction. Patient referred to me for further evaluation. In retrospect patient had another episode of lightheadedness and left leg numbness in December 2016, transient, which she did not seek medical attention for. Patient still has some mild vision difficulty. Her left-sided numbness and weakness has improved.   REVIEW OF SYSTEMS: Full 14 system review of systems performed and negative except: fatigue blurred vision feeling cold memory loss sleepiness restless legs decreased energy. History of thyroid removal 1998.  ALLERGIES: No Known Allergies  HOME MEDICATIONS: Outpatient Prescriptions Prior to Visit  Medication Sig Dispense Refill  . levothyroxine (SYNTHROID, LEVOTHROID) 112 MCG tablet Take 112 mcg by mouth daily before breakfast.    . amLODipine (NORVASC) 2.5 MG tablet TK 1 T PO D PRN  0  . aspirin 81 MG tablet Take 325 mg by mouth daily.     Marland Kitchen PRESCRIPTION MEDICATION 40 mg daily. 09/01/15 BP med, name unknown to patient today     No facility-administered medications prior to visit.    PAST MEDICAL HISTORY: Past Medical History  Diagnosis  Date  . Cerebral infarction ([place])   . Hypertension     PAST SURGICAL HISTORY: Past Surgical History  Procedure Laterality Date  . Thyroidectomy  1998  . Tonsillectomy      as child    FAMILY HISTORY: Family History  Problem Relation Age of Onset  . Cancer - Cervical Mother   . Cancer Father     esophageal    SOCIAL HISTORY:  Social History   Social History  . Marital Status: Married    Spouse Name: N/A  . Number of Children: 0  . Years of Education: 12   Occupational History  . N/a     retired   Social History Main Topics  . Smoking status: Former Smoker    Quit date: 08/31/1996  . Smokeless tobacco: Not on file  . Alcohol Use: 0.0 oz/week    0 Standard drinks or equivalent per week     Comment: 4 glasses wine/weekly  . Drug Use: No  . Sexual Activity: Not on file   Other Topics Concern  . Not on file   Social History Narrative   Lives at home with husband   Caffeine use- very little     PHYSICAL EXAM  GENERAL EXAM/CONSTITUTIONAL: Vitals:  Filed Vitals:   12/05/15 1015  BP: 123/74  Pulse: 80  Height: 5\' 7"  (1.702 m)  Weight: 134 lb 6.4 oz (60.963 kg)   Body mass index is 21.04 kg/(m^2). No exam data present  Patient is in no distress; well developed, nourished and groomed; neck is supple  CARDIOVASCULAR:  Examination of carotid arteries is normal; no carotid bruits  Regular rate and rhythm, no murmurs  Examination of peripheral vascular system by observation and palpation is normal  EYES:  Ophthalmoscopic exam of optic discs and posterior segments is normal; no papilledema or hemorrhages  MUSCULOSKELETAL:  Gait, strength, tone, movements noted in Neurologic exam below  NEUROLOGIC: MENTAL STATUS:  No flowsheet data found.  awake, alert, oriented to person, place and time  recent and remote memory intact  normal attention and concentration  language fluent, comprehension intact, naming intact,   fund of knowledge  appropriate  CRANIAL NERVE:   2nd - no papilledema on fundoscopic exam  2nd, 3rd, 4th, 6th - pupils equal and reactive to light, visual fields full to confrontation, extraocular muscles intact, no nystagmus  5th - facial sensation symmetric  7th - facial strength symmetric  8th - hearing intact  9th - palate elevates symmetrically, uvula midline  11th - shoulder shrug symmetric  12th - tongue protrusion midline  MOTOR:   normal bulk and tone, full strength in the BUE, BLE  SENSORY:   normal and symmetric to light touch, temperature, vibration  COORDINATION:   finger-nose-finger, fine finger movements normal  REFLEXES:   deep tendon reflexes present and symmetric  GAIT/STATION:   narrow based gait; able to walk tandem; romberg is negative    DIAGNOSTIC DATA (LABS, IMAGING, TESTING) - I reviewed patient records, labs, notes, testing and imaging myself where available.  No results found for: WBC, HGB, HCT, MCV, PLT No results found for: NA, K, CL, CO2, GLUCOSE, BUN, CREATININE, CALCIUM, PROT, ALBUMIN, AST, ALT, ALKPHOS, BILITOT, GFRNONAA, GFRAA No results found for: CHOL, HDL, LDLCALC, LDLDIRECT, TRIG, CHOLHDL No results found for: HGBA1C No results found for: VITAMINB12 No results found for: TSH  08/19/15 MRI brain [I reviewed images myself and agree with interpretation. -VRP]  - acute-subacute right occipital pole stroke - mild chronic small vessel ischemic disease   08/21/15 Carotid u/s  - no stenosis  08/21/15 TTE - Left ventricle: The cavity size was normal. There was mild focal basal hypertrophy of the septum. Systolic function was normal. The estimated ejection fraction was in the range of 60% to 65%. Doppler parameters are consistent with abnormal left ventricular relaxation (grade 1 diastolic dysfunction). - Mitral valve: There was mild regurgitation. - Pericardium, extracardiac: A trivial pericardial effusion was identified posterior to the  heart.  09/16/15 MRA head  - Moderate stenosis in distal cavernous right internal carotid artery - 2 mm downward pointing aneurysm in the distal left cavernous and proximal left supraclinoid internal carotid artery - High-grade stenosis of the proximal nondominant right A1 segment - Moderate narrowing of mid and distal small vessels in both the anterior and posterior circulation compatible with atherosclerotic disease - High-grade stenosis of the distal right P2 segment  11/17/15 Sleep study  - Normal AHI; no significant sleep apnea - Mild to moderate snoring - Sleep fragmentation - Mild periodic limb movements    ASSESSMENT AND PLAN  65 y.o. year old female here with right occipital ischemic infarction, which appears to be embolic based on imaging characteristics. MRA head shows multiple areas of intracranial atherosclerosis, including right P2 stenosis (which is likely symptomatic and the cause of stroke), and related to underlying uncontrolled hypertension.   Dx:  1. Occipital stroke ([place])   2. RLS (restless legs syndrome)      PLAN: - continue aspirin 325mg  daily - continue valsartan 80mg  daily - follow up with PCP re:  diabetes and lipid screening/treatment - check cardiac monitor to rule out paroxysmal atrial fibrillation - stroke warning signs and education reviewed  Return in about 6 months (around 06/06/2016).   Penni Bombard, MD [id number], [id number] AM Certified in Neurology, Neurophysiology and Neuroimaging  [facility] [address], [place], [place] [address] [phone]

## 2016-02-12 ENCOUNTER — Telehealth: Payer: Self-pay | Admitting: *Deleted

## 2016-02-12 NOTE — Telephone Encounter (Signed)
Spoke with patient and informed her, per Dr Leta Baptist her cardiac event monitoring showed no irregular rhythms, no atrial fibrillation. Apologized for the delay in calling results to her. She verbalized understanding, appreciation for call.

## 2016-02-18 DIAGNOSIS — H5340 Unspecified visual field defects: Secondary | ICD-10-CM | POA: Diagnosis not present

## 2016-02-18 DIAGNOSIS — H353131 Nonexudative age-related macular degeneration, bilateral, early dry stage: Secondary | ICD-10-CM | POA: Diagnosis not present

## 2016-02-18 DIAGNOSIS — H2513 Age-related nuclear cataract, bilateral: Secondary | ICD-10-CM | POA: Diagnosis not present

## 2016-02-18 DIAGNOSIS — I6340 Cerebral infarction due to embolism of unspecified cerebral artery: Secondary | ICD-10-CM | POA: Diagnosis not present

## 2016-03-17 ENCOUNTER — Encounter: Payer: Self-pay | Admitting: Diagnostic Neuroimaging

## 2016-03-29 DIAGNOSIS — E559 Vitamin D deficiency, unspecified: Secondary | ICD-10-CM | POA: Diagnosis not present

## 2016-03-29 DIAGNOSIS — E890 Postprocedural hypothyroidism: Secondary | ICD-10-CM | POA: Diagnosis not present

## 2016-03-29 DIAGNOSIS — N6019 Diffuse cystic mastopathy of unspecified breast: Secondary | ICD-10-CM | POA: Diagnosis not present

## 2016-03-29 DIAGNOSIS — E784 Other hyperlipidemia: Secondary | ICD-10-CM | POA: Diagnosis not present

## 2016-03-29 DIAGNOSIS — I10 Essential (primary) hypertension: Secondary | ICD-10-CM | POA: Diagnosis not present

## 2016-03-29 DIAGNOSIS — D126 Benign neoplasm of colon, unspecified: Secondary | ICD-10-CM | POA: Diagnosis not present

## 2016-03-29 DIAGNOSIS — I6312 Cerebral infarction due to embolism of basilar artery: Secondary | ICD-10-CM | POA: Diagnosis not present

## 2016-03-29 DIAGNOSIS — G4761 Periodic limb movement disorder: Secondary | ICD-10-CM | POA: Diagnosis not present

## 2016-03-29 DIAGNOSIS — Z6822 Body mass index (BMI) 22.0-22.9, adult: Secondary | ICD-10-CM | POA: Diagnosis not present

## 2016-03-29 DIAGNOSIS — E785 Hyperlipidemia, unspecified: Secondary | ICD-10-CM | POA: Diagnosis not present

## 2016-03-31 DIAGNOSIS — Z01419 Encounter for gynecological examination (general) (routine) without abnormal findings: Secondary | ICD-10-CM | POA: Diagnosis not present

## 2016-06-07 ENCOUNTER — Ambulatory Visit: Payer: PPO | Admitting: Diagnostic Neuroimaging

## 2016-06-14 ENCOUNTER — Ambulatory Visit (INDEPENDENT_AMBULATORY_CARE_PROVIDER_SITE_OTHER): Payer: PPO | Admitting: Diagnostic Neuroimaging

## 2016-06-14 ENCOUNTER — Encounter: Payer: Self-pay | Admitting: Diagnostic Neuroimaging

## 2016-06-14 VITALS — BP 129/68 | HR 72 | Wt 139.0 lb

## 2016-06-14 DIAGNOSIS — I639 Cerebral infarction, unspecified: Secondary | ICD-10-CM | POA: Insufficient documentation

## 2016-06-14 DIAGNOSIS — I671 Cerebral aneurysm, nonruptured: Secondary | ICD-10-CM | POA: Diagnosis not present

## 2016-06-14 DIAGNOSIS — G2581 Restless legs syndrome: Secondary | ICD-10-CM

## 2016-06-14 DIAGNOSIS — I10 Essential (primary) hypertension: Secondary | ICD-10-CM | POA: Diagnosis not present

## 2016-06-14 NOTE — Progress Notes (Signed)
[facility]  PATIENT: Bethany Ross DOB: Aug 26, 1950  REFERRING CLINICIAN: Forde Dandy, S  HISTORY FROM: patient  REASON FOR VISIT: follow up   HISTORICAL  CHIEF COMPLAINT:  Chief Complaint  Patient presents with  . Occipital stroke    rm 7, "doing well, feeling good"  . Follow-up    6 month    HISTORY OF PRESENT ILLNESS:   UPDATE 06/14/16: Since last visit, doing well. No new issues. Vision stable. Now on aspirin 81mg  daily (due to excessive bruising).   UPDATE 12/05/15: Since last visit, doing well. No new events. Still with fatigue. Left side vision blurriness stable. Still with some fatigue.   PRIOR HPI (09/01/15): 65 year old right-handed female with hypertension, here for evaluation of stroke. 08/11/15 patient noticed blurred vision from her left eye towards the left side. On 08/18/15 patient had numbness of left leg. Patient went to PCP for evaluation, had MRI of the brain ordered, which showed a right occipital polar ischemic infarction. Patient referred to me for further evaluation. In retrospect patient had another episode of lightheadedness and left leg numbness in December 2016, transient, which she did not seek medical attention for. Patient still has some mild vision difficulty. Her left-sided numbness and weakness has improved.   REVIEW OF SYSTEMS: Full 14 system review of systems performed and negative except: blurred vision feeling cold. History of thyroid removal 1998.  ALLERGIES: No Known Allergies  HOME MEDICATIONS: Outpatient Medications Prior to Visit  Medication Sig Dispense Refill  . aspirin 325 MG tablet Take 325 mg by mouth daily.    Marland Kitchen levothyroxine (SYNTHROID, LEVOTHROID) 112 MCG tablet Take 112 mcg by mouth daily before breakfast.    . montelukast (SINGULAIR) 10 MG tablet 10 mg. Reported on 12/05/2015  5  . valsartan (DIOVAN) 80 MG tablet 80 mg daily.  5   No facility-administered medications prior to visit.     PAST MEDICAL  HISTORY: Past Medical History:  Diagnosis Date  . Cerebral infarction ([place])   . Hypertension     PAST SURGICAL HISTORY: Past Surgical History:  Procedure Laterality Date  . THYROIDECTOMY  1998  . TONSILLECTOMY     as child    FAMILY HISTORY: Family History  Problem Relation Age of Onset  . Cancer - Cervical Mother   . Cancer Father     esophageal    SOCIAL HISTORY:  Social History   Social History  . Marital status: Married    Spouse name: N/A  . Number of children: 0  . Years of education: 12   Occupational History  . N/a     retired   Social History Main Topics  . Smoking status: Former Smoker    Quit date: 08/31/1996  . Smokeless tobacco: Never Used  . Alcohol use 0.0 oz/week     Comment: 4 glasses wine/weekly  . Drug use: No  . Sexual activity: Not on file   Other Topics Concern  . Not on file   Social History Narrative   Lives at home with husband   Caffeine use- very little     PHYSICAL EXAM  GENERAL EXAM/CONSTITUTIONAL: Vitals:  Vitals:   06/14/16 1248  BP: 129/68  Pulse: 72  Weight: 139 lb (63 kg)   Body mass index is 21.77 kg/m. No exam data present  Patient is in no distress; well developed, nourished and groomed; neck is supple  CARDIOVASCULAR:  Examination of carotid arteries is normal; no carotid bruits  Regular rate and rhythm, no  murmurs  Examination of peripheral vascular system by observation and palpation is normal  EYES:  Ophthalmoscopic exam of optic discs and posterior segments is normal; no papilledema or hemorrhages  MUSCULOSKELETAL:  Gait, strength, tone, movements noted in Neurologic exam below  NEUROLOGIC: MENTAL STATUS:  No flowsheet data found.  awake, alert, oriented to person, place and time  recent and remote memory intact  normal attention and concentration  language fluent, comprehension intact, naming intact,   fund of knowledge appropriate  CRANIAL NERVE:   2nd - no papilledema on  fundoscopic exam  2nd, 3rd, 4th, 6th - pupils equal and reactive to light, visual fields full to confrontation, extraocular muscles intact, no nystagmus  5th - facial sensation symmetric  7th - facial strength symmetric  8th - hearing intact  9th - palate elevates symmetrically, uvula midline  11th - shoulder shrug symmetric  12th - tongue protrusion midline  MOTOR:   normal bulk and tone, full strength in the BUE, BLE  SENSORY:   normal and symmetric to light touch  COORDINATION:   finger-nose-finger, fine finger movements normal  REFLEXES:   deep tendon reflexes present and symmetric  GAIT/STATION:   narrow based gait    DIAGNOSTIC DATA (LABS, IMAGING, TESTING) - I reviewed patient records, labs, notes, testing and imaging myself where available.  No results found for: WBC, HGB, HCT, MCV, PLT No results found for: NA, K, CL, CO2, GLUCOSE, BUN, CREATININE, CALCIUM, PROT, ALBUMIN, AST, ALT, ALKPHOS, BILITOT, GFRNONAA, GFRAA No results found for: CHOL, HDL, LDLCALC, LDLDIRECT, TRIG, CHOLHDL No results found for: HGBA1C No results found for: VITAMINB12 No results found for: TSH  08/19/15 MRI brain [I reviewed images myself and agree with interpretation. -VRP]  - acute-subacute right occipital pole stroke - mild chronic small vessel ischemic disease   08/21/15 Carotid u/s  - no stenosis  08/21/15 TTE - Left ventricle: The cavity size was normal. There was mild focal basal hypertrophy of the septum. Systolic function was normal. The estimated ejection fraction was in the range of 60% to 65%. Doppler parameters are consistent with abnormal left ventricular relaxation (grade 1 diastolic dysfunction). - Mitral valve: There was mild regurgitation. - Pericardium, extracardiac: A trivial pericardial effusion was identified posterior to the heart.  09/16/15 MRA head  - Moderate stenosis in distal cavernous right internal carotid artery - 2 mm downward pointing  aneurysm in the distal left cavernous and proximal left supraclinoid internal carotid artery - High-grade stenosis of the proximal nondominant right A1 segment - Moderate narrowing of mid and distal small vessels in both the anterior and posterior circulation compatible with atherosclerotic disease - High-grade stenosis of the distal right P2 segment  11/17/15 Sleep study  - Normal AHI; no significant sleep apnea - Mild to moderate snoring - Sleep fragmentation - Mild periodic limb movements  12/05/15 cardiac monitor (37 days) - sinus rhythm     ASSESSMENT AND PLAN  65 y.o. year old female here with right occipital ischemic infarction, which appears to be embolic based on imaging characteristics. MRA head shows multiple areas of intracranial atherosclerosis, including right P2 stenosis (which is likely symptomatic and the cause of stroke), and related to underlying uncontrolled hypertension. Also with tiny unruptured cerebral aneurysm (asymptomatic) which we will follow up in Jan 2018.   Dx:  1. Occipital stroke ([employer])   2. Essential hypertension   3. RLS (restless legs syndrome)   4. Unruptured cerebral aneurysm      PLAN: - continue aspirin 81mg   daily - continue valsartan 80mg  daily - follow up with PCP re: diabetes and lipid screening/treatment - repeat MRA head in January 2018 to follow up aneurysm and intracranial stenoses (patient would like scan on same date as her PCP appointment) - stroke warning signs and education reviewed  Return in about 1 year (around 06/14/2017).   Penni Bombard, MD 0000000, 0000000 PM Certified in Neurology, Neurophysiology and Neuroimaging  [facility] [address] [place], [place] [address] [phone]

## 2016-06-28 ENCOUNTER — Ambulatory Visit: Payer: PPO | Admitting: Diagnostic Neuroimaging

## 2016-07-21 ENCOUNTER — Ambulatory Visit
Admission: RE | Admit: 2016-07-21 | Discharge: 2016-07-21 | Disposition: A | Discharge status: Home / Self Care | Payer: PPO | Source: Ambulatory Visit | Attending: Diagnostic Neuroimaging | Admitting: Diagnostic Neuroimaging

## 2016-07-21 DIAGNOSIS — I639 Cerebral infarction, unspecified: Secondary | ICD-10-CM | POA: Diagnosis not present

## 2016-07-21 DIAGNOSIS — I671 Cerebral aneurysm, nonruptured: Secondary | ICD-10-CM | POA: Diagnosis not present

## 2016-07-23 ENCOUNTER — Telehealth: Payer: Self-pay | Admitting: *Deleted

## 2016-07-23 NOTE — Telephone Encounter (Signed)
Per Dr Leta Baptist, spoke with patient and informed her that her MRA brain is stable from previous MRA. Advised her Dr Leta Baptist will continue with her current treatment plan. Reviewed plan from last office visit note. Reviewed stroke warning signs with her. Confirmed her one year follow up. Advised she call for any problems, concerns before then.  She verbalized understanding, appreciation for call.

## 2016-07-28 DIAGNOSIS — Z1389 Encounter for screening for other disorder: Secondary | ICD-10-CM | POA: Diagnosis not present

## 2016-07-28 DIAGNOSIS — E784 Other hyperlipidemia: Secondary | ICD-10-CM | POA: Diagnosis not present

## 2016-07-28 DIAGNOSIS — I10 Essential (primary) hypertension: Secondary | ICD-10-CM | POA: Diagnosis not present

## 2016-07-28 DIAGNOSIS — D126 Benign neoplasm of colon, unspecified: Secondary | ICD-10-CM | POA: Diagnosis not present

## 2016-07-28 DIAGNOSIS — I6312 Cerebral infarction due to embolism of basilar artery: Secondary | ICD-10-CM | POA: Diagnosis not present

## 2016-07-28 DIAGNOSIS — E559 Vitamin D deficiency, unspecified: Secondary | ICD-10-CM | POA: Diagnosis not present

## 2016-07-28 DIAGNOSIS — M810 Age-related osteoporosis without current pathological fracture: Secondary | ICD-10-CM | POA: Diagnosis not present

## 2016-07-28 DIAGNOSIS — E890 Postprocedural hypothyroidism: Secondary | ICD-10-CM | POA: Diagnosis not present

## 2016-09-27 DIAGNOSIS — H353131 Nonexudative age-related macular degeneration, bilateral, early dry stage: Secondary | ICD-10-CM | POA: Diagnosis not present

## 2016-09-27 DIAGNOSIS — H5340 Unspecified visual field defects: Secondary | ICD-10-CM | POA: Diagnosis not present

## 2016-09-27 DIAGNOSIS — I6340 Cerebral infarction due to embolism of unspecified cerebral artery: Secondary | ICD-10-CM | POA: Diagnosis not present

## 2016-10-13 ENCOUNTER — Encounter: Payer: Self-pay | Admitting: Diagnostic Neuroimaging

## 2016-10-13 ENCOUNTER — Ambulatory Visit (INDEPENDENT_AMBULATORY_CARE_PROVIDER_SITE_OTHER): Payer: PPO | Admitting: Diagnostic Neuroimaging

## 2016-10-13 VITALS — BP 146/83 | HR 72 | Wt 135.2 lb

## 2016-10-13 DIAGNOSIS — H53483 Generalized contraction of visual field, bilateral: Secondary | ICD-10-CM

## 2016-10-13 DIAGNOSIS — I671 Cerebral aneurysm, nonruptured: Secondary | ICD-10-CM | POA: Diagnosis not present

## 2016-10-13 DIAGNOSIS — I639 Cerebral infarction, unspecified: Secondary | ICD-10-CM | POA: Diagnosis not present

## 2016-10-13 NOTE — Progress Notes (Signed)
[facility]  PATIENT: Bethany Ross DOB: [birth year]  REFERRING CLINICIAN: Forde Dandy, S  HISTORY FROM: patient  REASON FOR VISIT: follow up   HISTORICAL  CHIEF COMPLAINT:  Chief Complaint  Patient presents with  . Occipital stroke    rm 7, "FU per eye dr, possible vision loss"    HISTORY OF PRESENT ILLNESS:   UPDATE 10/13/16: Since last visit, patient feels well. Ophthal visit on 09/27/16, shows some progression of vision field constriction since Sept 2017, and therefore eye doctor requested neurology follow up. Patient does not feel an vision changes. No new neuro symptoms.   UPDATE 06/14/16: Since last visit, doing well. No new issues. Vision stable. Now on aspirin 81mg  daily (due to excessive bruising).   UPDATE 12/05/15: Since last visit, doing well. No new events. Still with fatigue. Left side vision blurriness stable. Still with some fatigue.   PRIOR HPI (09/01/15): 66 year old right-handed female with hypertension, here for evaluation of stroke. 08/11/15 patient noticed blurred vision from her left eye towards the left side. On 08/18/15 patient had numbness of left leg. Patient went to PCP for evaluation, had MRI of the brain ordered, which showed a right occipital polar ischemic infarction. Patient referred to me for further evaluation. In retrospect patient had another episode of lightheadedness and left leg numbness in December 2016, transient, which she did not seek medical attention for. Patient still has some mild vision difficulty. Her left-sided numbness and weakness has improved.   REVIEW OF SYSTEMS: Full 14 system review of systems performed and negative except: restless legs.    ALLERGIES: No Known Allergies  HOME MEDICATIONS: Outpatient Medications Prior to Visit  Medication Sig Dispense Refill  . amLODipine (NORVASC) 2.5 MG tablet 2.5 mg at bedtime.  5  . aspirin 325 MG tablet Take 325 mg by mouth daily.    Marland Kitchen levothyroxine (SYNTHROID,  LEVOTHROID) 112 MCG tablet Take 112 mcg by mouth daily before breakfast.    . montelukast (SINGULAIR) 10 MG tablet 10 mg. Reported on 12/05/2015  5  . valsartan (DIOVAN) 80 MG tablet 80 mg daily.  5   No facility-administered medications prior to visit.     PAST MEDICAL HISTORY: Past Medical History:  Diagnosis Date  . Cerebral infarction ([place])   . Hypertension     PAST SURGICAL HISTORY: Past Surgical History:  Procedure Laterality Date  . THYROIDECTOMY  1998  . TONSILLECTOMY     as child    FAMILY HISTORY: Family History  Problem Relation Age of Onset  . Cancer - Cervical Mother   . Cancer Father     esophageal    SOCIAL HISTORY:  Social History   Social History  . Marital status: Married    Spouse name: [name]  . Number of children: 0  . Years of education: 12   Occupational History  . N/a     retired   Social History Main Topics  . Smoking status: Former Smoker    Quit date: 08/31/1996  . Smokeless tobacco: Never Used  . Alcohol use 0.0 oz/week     Comment: 4 glasses wine/weekly  . Drug use: No  . Sexual activity: Not on file   Other Topics Concern  . Not on file   Social History Narrative   Lives at home with husband   Caffeine use- very little     PHYSICAL EXAM  GENERAL EXAM/CONSTITUTIONAL: Vitals:  Vitals:   10/13/16 0835  BP: (!) 146/83  Pulse: 72  Weight: 135  lb 3.2 oz (61.3 kg)   Body mass index is 21.18 kg/m. No exam data present  Patient is in no distress; well developed, nourished and groomed; neck is supple  CARDIOVASCULAR:  Examination of carotid arteries is normal; no carotid bruits  Regular rate and rhythm, no murmurs  Examination of peripheral vascular system by observation and palpation is normal  EYES:  Ophthalmoscopic exam of optic discs and posterior segments is normal; no papilledema or hemorrhages  MUSCULOSKELETAL:  Gait, strength, tone, movements noted in Neurologic exam below  NEUROLOGIC: MENTAL  STATUS:  No flowsheet data found.  awake, alert, oriented to person, place and time  recent and remote memory intact  normal attention and concentration  language fluent, comprehension intact, naming intact,   fund of knowledge appropriate  CRANIAL NERVE:   2nd - no papilledema on fundoscopic exam  2nd, 3rd, 4th, 6th - pupils equal and reactive to light, visual fields full to confrontation, extraocular muscles intact, no nystagmus  5th - facial sensation symmetric  7th - facial strength symmetric  8th - hearing intact  9th - palate elevates symmetrically, uvula midline  11th - shoulder shrug symmetric  12th - tongue protrusion midline  MOTOR:   normal bulk and tone, full strength in the BUE, BLE  SENSORY:   normal and symmetric to light touch  COORDINATION:   finger-nose-finger, fine finger movements normal  REFLEXES:   deep tendon reflexes present and symmetric  GAIT/STATION:   narrow based gait    DIAGNOSTIC DATA (LABS, IMAGING, TESTING) - I reviewed patient records, labs, notes, testing and imaging myself where available.  No results found for: WBC, HGB, HCT, MCV, PLT No results found for: NA, K, CL, CO2, GLUCOSE, BUN, CREATININE, CALCIUM, PROT, ALBUMIN, AST, ALT, ALKPHOS, BILITOT, GFRNONAA, GFRAA No results found for: CHOL, HDL, LDLCALC, LDLDIRECT, TRIG, CHOLHDL No results found for: HGBA1C No results found for: VITAMINB12 No results found for: TSH  08/19/15 MRI brain [I reviewed images myself and agree with interpretation. -VRP]  - acute-subacute right occipital pole stroke - mild chronic small vessel ischemic disease   08/21/15 Carotid u/s  - no stenosis  08/21/15 TTE - Left ventricle: The cavity size was normal. There was mild focal basal hypertrophy of the septum. Systolic function was normal. The estimated ejection fraction was in the range of 60% to 65%. Doppler parameters are consistent with abnormal left ventricular relaxation  (grade 1 diastolic dysfunction). - Mitral valve: There was mild regurgitation. - Pericardium, extracardiac: A trivial pericardial effusion was identified posterior to the heart.  09/16/15 MRA head  - Moderate stenosis in distal cavernous right internal carotid artery - 2 mm downward pointing aneurysm in the distal left cavernous and proximal left supraclinoid internal carotid artery - High-grade stenosis of the proximal nondominant right A1 segment - Moderate narrowing of mid and distal small vessels in both the anterior and posterior circulation compatible with atherosclerotic disease - High-grade stenosis of the distal right P2 segment  11/17/15 Sleep study  - Normal AHI; no significant sleep apnea - Mild to moderate snoring - Sleep fragmentation - Mild periodic limb movements  12/05/15 cardiac monitor (37 days) - sinus rhythm   07/21/16 MRA head  - severe focal stenosis of the right posterior cerebral artery in its midportion and moderate stenosis of cavernous segment of right internal carotid artery. Stable 2 mm dilatation of the distal left cavernous internal carotid artery may represent infundibulum versus aneurysm. No significant change compared with MRA dated 09/16/15.  ASSESSMENT AND PLAN  66 y.o. year old female here with right occipital ischemic infarction, which appears to be embolic based on imaging characteristics. MRA head shows multiple areas of intracranial atherosclerosis, including right P2 stenosis (which is likely symptomatic and the cause of stroke), and related to underlying uncontrolled hypertension. Also with tiny unruptured cerebral aneurysm (asymptomatic) which is stable from 2017 - 2018.   Now with increased vision constriction on vision field testing per ophthalmology in March 2018, but no subjective changes per patient.   Dx:  1. Visual field constriction, bilateral   2. Occipital stroke ([place])   3. Unruptured cerebral aneurysm      PLAN: - repeat  MRI brain now (eval for new vision field change per ophthalmology) - check lipids and A1c - continue aspirin 81mg  daily - continue valsartan 80mg  daily + amlodipine 2.5mg  at bedtime - follow up with Dr. Forde Dandy diabetes and lipid screening/treatment; patient was intolerant of statin apparently, but may be candidate for a different statin, ezetimibe or PCSK9 inhibitors - repeat MRA head in January 2020 to follow up aneurysm and intracranial stenoses - stroke warning signs and education reviewed  Orders Placed This Encounter  Procedures  . MR BRAIN WO CONTRAST  . Lipid Panel  . Hemoglobin A1c   Return in about 3 months (around 01/13/2017).   Penni Bombard, MD 0/96/2836, 6:29 AM Certified in Neurology, Neurophysiology and Neuroimaging  [facility] [address] [place], [place] [address] [phone]

## 2016-10-14 LAB — LIPID PANEL
CHOL/HDL RATIO: 3.3 ratio (ref 0.0–4.4)
Cholesterol, Total: 206 mg/dL — ABNORMAL HIGH (ref 100–199)
HDL: 63 mg/dL (ref >39)
LDL Calculated: 128 mg/dL — ABNORMAL HIGH (ref 0–99)
Triglycerides: 75 mg/dL (ref 0–149)
VLDL CHOLESTEROL CAL: 15 mg/dL (ref 5–40)

## 2016-10-14 LAB — HEMOGLOBIN A1C
Est. average glucose Bld gHb Est-mCnc: 105 mg/dL
Hgb A1c MFr Bld: 5.3 % (ref 4.8–5.6)

## 2016-10-18 ENCOUNTER — Telehealth: Payer: Self-pay | Admitting: *Deleted

## 2016-10-18 NOTE — Telephone Encounter (Signed)
LVM requesting call back re: lab results.  

## 2016-10-18 NOTE — Telephone Encounter (Signed)
Received call back from patient and informed her, per Dr Leta Baptist that her lipids are elevated. He advised sh contact her PCP. Patient stated she has upcoming appointment with PCP. She stated she was put on a statin after her stroke but did not tolerate it well. She will adjust her diet and discuss with her PCP. She verbalized understanding, appreciation.

## 2016-11-02 ENCOUNTER — Ambulatory Visit
Admission: RE | Admit: 2016-11-02 | Discharge: 2016-11-02 | Disposition: A | Discharge status: Home / Self Care | Payer: PPO | Source: Ambulatory Visit | Attending: Diagnostic Neuroimaging | Admitting: Diagnostic Neuroimaging

## 2016-11-02 DIAGNOSIS — H53483 Generalized contraction of visual field, bilateral: Secondary | ICD-10-CM

## 2016-11-02 DIAGNOSIS — I671 Cerebral aneurysm, nonruptured: Secondary | ICD-10-CM

## 2016-11-02 DIAGNOSIS — I639 Cerebral infarction, unspecified: Secondary | ICD-10-CM

## 2016-11-02 DIAGNOSIS — H539 Unspecified visual disturbance: Secondary | ICD-10-CM | POA: Diagnosis not present

## 2016-11-10 ENCOUNTER — Telehealth: Payer: Self-pay | Admitting: *Deleted

## 2016-11-10 NOTE — Telephone Encounter (Signed)
-----   Message from Penni Bombard, MD sent at 11/09/2016  4:18 PM EDT ----- No new findings. Old stroke. Old bone spur. Please call patient. Continue current plan. -VRP

## 2016-11-10 NOTE — Telephone Encounter (Signed)
Called pt and relayed that her MRI showed no new findings. (old stroke and bone spur).  She asked about the bone spur, wanted more information.  Please call.

## 2016-11-11 NOTE — Telephone Encounter (Signed)
Old bone spur is small. Not a major finding. Nothing to worry about. -VRP

## 2016-11-11 NOTE — Telephone Encounter (Signed)
Spoke to pt and relayed that Dr. Leta Baptist stated that bony spurs can happen anywhere where there is bone. It is on left frontal skull, not a major finding, nothing to worry about.   Can discuss at next appt in 01/2017.  She verbalized understanding.

## 2017-01-12 DIAGNOSIS — H5340 Unspecified visual field defects: Secondary | ICD-10-CM | POA: Diagnosis not present

## 2017-01-24 ENCOUNTER — Ambulatory Visit: Payer: PPO | Admitting: Diagnostic Neuroimaging

## 2017-02-08 DIAGNOSIS — E890 Postprocedural hypothyroidism: Secondary | ICD-10-CM | POA: Diagnosis not present

## 2017-02-08 DIAGNOSIS — I6312 Cerebral infarction due to embolism of basilar artery: Secondary | ICD-10-CM | POA: Diagnosis not present

## 2017-02-08 DIAGNOSIS — Z6822 Body mass index (BMI) 22.0-22.9, adult: Secondary | ICD-10-CM | POA: Diagnosis not present

## 2017-02-08 DIAGNOSIS — I10 Essential (primary) hypertension: Secondary | ICD-10-CM | POA: Diagnosis not present

## 2017-02-08 DIAGNOSIS — E784 Other hyperlipidemia: Secondary | ICD-10-CM | POA: Diagnosis not present

## 2017-02-08 DIAGNOSIS — D126 Benign neoplasm of colon, unspecified: Secondary | ICD-10-CM | POA: Diagnosis not present

## 2017-04-05 ENCOUNTER — Ambulatory Visit: Payer: PPO | Admitting: Diagnostic Neuroimaging

## 2017-06-15 ENCOUNTER — Ambulatory Visit (INDEPENDENT_AMBULATORY_CARE_PROVIDER_SITE_OTHER): Payer: PPO | Admitting: Diagnostic Neuroimaging

## 2017-06-15 ENCOUNTER — Encounter: Payer: Self-pay | Admitting: Diagnostic Neuroimaging

## 2017-06-15 VITALS — BP 153/84 | HR 68 | Ht 67.0 in | Wt 137.4 lb

## 2017-06-15 DIAGNOSIS — I639 Cerebral infarction, unspecified: Secondary | ICD-10-CM | POA: Diagnosis not present

## 2017-06-15 DIAGNOSIS — G2581 Restless legs syndrome: Secondary | ICD-10-CM

## 2017-06-15 DIAGNOSIS — I671 Cerebral aneurysm, nonruptured: Secondary | ICD-10-CM | POA: Diagnosis not present

## 2017-06-15 DIAGNOSIS — I10 Essential (primary) hypertension: Secondary | ICD-10-CM | POA: Diagnosis not present

## 2017-06-15 NOTE — Progress Notes (Signed)
[facility]  PATIENT: Bethany BLUESTEIN DOB: 06-23-1951  REFERRING CLINICIAN: Forde Dandy, S  HISTORY FROM: patient  REASON FOR VISIT: follow up   HISTORICAL  CHIEF COMPLAINT:  Chief Complaint  Patient presents with  . Follow-up  . occipital stroke    f/u MRI, labs,   doing ok, vision no change.  Has L plantar fascitis.      HISTORY OF PRESENT ILLNESS:   UPDATE (06/15/17, VRP): Since last visit, doing well. Tolerating meds. No alleviating or aggravating factors.  UPDATE 10/13/16: Since last visit, patient feels well. Ophthal visit on 09/27/16, shows some progression of vision field constriction since Sept 2017, and therefore eye doctor requested neurology follow up. Patient does not feel an vision changes. No new neuro symptoms.   UPDATE 06/14/16: Since last visit, doing well. No new issues. Vision stable. Now on aspirin 81mg  daily (due to excessive bruising).   UPDATE 12/05/15: Since last visit, doing well. No new events. Still with fatigue. Left side vision blurriness stable. Still with some fatigue.   PRIOR HPI (09/01/15): 66 year old right-handed female with hypertension, here for evaluation of stroke. 08/11/15 patient noticed blurred vision from her left eye towards the left side. On 08/18/15 patient had numbness of left leg. Patient went to PCP for evaluation, had MRI of the brain ordered, which showed a right occipital polar ischemic infarction. Patient referred to me for further evaluation. In retrospect patient had another episode of lightheadedness and left leg numbness in December 2016, transient, which she did not seek medical attention for. Patient still has some mild vision difficulty. Her left-sided numbness and weakness has improved.   REVIEW OF SYSTEMS: Full 14 system review of systems performed and negative except: restless legs.    ALLERGIES: No Known Allergies  HOME MEDICATIONS: Outpatient Medications Prior to Visit  Medication Sig Dispense Refill   . amLODipine (NORVASC) 2.5 MG tablet 2.5 mg at bedtime.  5  . aspirin 325 MG tablet Take 325 mg by mouth daily.    Marland Kitchen levothyroxine (SYNTHROID, LEVOTHROID) 112 MCG tablet Take 112 mcg by mouth daily before breakfast.    . valsartan (DIOVAN) 80 MG tablet 80 mg daily.  5  . montelukast (SINGULAIR) 10 MG tablet 10 mg. Reported on 12/05/2015  5   No facility-administered medications prior to visit.     PAST MEDICAL HISTORY: Past Medical History:  Diagnosis Date  . Cerebral infarction ([place])   . Hypertension     PAST SURGICAL HISTORY: Past Surgical History:  Procedure Laterality Date  . THYROIDECTOMY  1998  . TONSILLECTOMY     as child    FAMILY HISTORY: Family History  Problem Relation Age of Onset  . Cancer - Cervical Mother   . Cancer Father        esophageal    SOCIAL HISTORY:  Social History   Socioeconomic History  . Marital status: Married    Spouse name: [name]  . Number of children: 0  . Years of education: 82  . Highest education level: Not on file  Social Needs  . Financial resource strain: Not on file  . Food insecurity - worry: Not on file  . Food insecurity - inability: Not on file  . Transportation needs - medical: Not on file  . Transportation needs - non-medical: Not on file  Occupational History  . Occupation: N/a    Comment: retired  Tobacco Use  . Smoking status: Former Smoker    Last attempt to quit: 08/31/1996  Years since quitting: 20.8  . Smokeless tobacco: Never Used  Substance and Sexual Activity  . Alcohol use: Yes    Alcohol/week: 0.0 oz    Comment: 4 glasses wine/weekly  . Drug use: No  . Sexual activity: Not on file  Other Topics Concern  . Not on file  Social History Narrative   Lives at home with husband   Caffeine use- very little     PHYSICAL EXAM  GENERAL EXAM/CONSTITUTIONAL: Vitals:  Vitals:   06/15/17 1322  BP: (!) 153/84  Pulse: 68  Weight: 137 lb 6.4 oz (62.3 kg)  Height: 5\' 7"  (1.702 m)   Body mass index  is 21.52 kg/m. No exam data present  Patient is in no distress; well developed, nourished and groomed; neck is supple  CARDIOVASCULAR:  Examination of carotid arteries is normal; no carotid bruits  Regular rate and rhythm, no murmurs  Examination of peripheral vascular system by observation and palpation is normal  EYES:  Ophthalmoscopic exam of optic discs and posterior segments is normal; no papilledema or hemorrhages  MUSCULOSKELETAL:  Gait, strength, tone, movements noted in Neurologic exam below  NEUROLOGIC: MENTAL STATUS:  No flowsheet data found.  awake, alert, oriented to person, place and time  recent and remote memory intact  normal attention and concentration  language fluent, comprehension intact, naming intact,   fund of knowledge appropriate  CRANIAL NERVE:   2nd - no papilledema on fundoscopic exam  2nd, 3rd, 4th, 6th - pupils equal and reactive to light, visual fields full to confrontation, extraocular muscles intact, no nystagmus  5th - facial sensation symmetric  7th - facial strength symmetric  8th - hearing intact  9th - palate elevates symmetrically, uvula midline  11th - shoulder shrug symmetric  12th - tongue protrusion midline  MOTOR:   normal bulk and tone, full strength in the BUE, BLE  SENSORY:   normal and symmetric to light touch  COORDINATION:   finger-nose-finger, fine finger movements normal  REFLEXES:   deep tendon reflexes present and symmetric  GAIT/STATION:   narrow based gait    DIAGNOSTIC DATA (LABS, IMAGING, TESTING) - I reviewed patient records, labs, notes, testing and imaging myself where available.  No results found for: WBC, HGB, HCT, MCV, PLT No results found for: NA, K, CL, CO2, GLUCOSE, BUN, CREATININE, CALCIUM, PROT, ALBUMIN, AST, ALT, ALKPHOS, BILITOT, GFRNONAA, GFRAA Lab Results  Component Value Date   CHOL 206 (H) 10/13/2016   HDL 63 10/13/2016   LDLCALC 128 (H) 10/13/2016   TRIG  75 10/13/2016   CHOLHDL 3.3 10/13/2016   Lab Results  Component Value Date   HGBA1C 5.3 10/13/2016   No results found for: VITAMINB12 No results found for: TSH  08/19/15 MRI brain [I reviewed images myself and agree with interpretation. -VRP]  - acute-subacute right occipital pole stroke - mild chronic small vessel ischemic disease   08/21/15 Carotid u/s  - no stenosis  08/21/15 TTE - Left ventricle: The cavity size was normal. There was mild focal basal hypertrophy of the septum. Systolic function was normal. The estimated ejection fraction was in the range of 60% to 65%. Doppler parameters are consistent with abnormal left ventricular relaxation (grade 1 diastolic dysfunction). - Mitral valve: There was mild regurgitation. - Pericardium, extracardiac: A trivial pericardial effusion was identified posterior to the heart.  09/16/15 MRA head  - Moderate stenosis in distal cavernous right internal carotid artery - 2 mm downward pointing aneurysm in the distal left cavernous and  proximal left supraclinoid internal carotid artery - High-grade stenosis of the proximal nondominant right A1 segment - Moderate narrowing of mid and distal small vessels in both the anterior and posterior circulation compatible with atherosclerotic disease - High-grade stenosis of the distal right P2 segment  11/17/15 Sleep study  - Normal AHI; no significant sleep apnea - Mild to moderate snoring - Sleep fragmentation - Mild periodic limb movements  12/05/15 cardiac monitor (37 days) - sinus rhythm   07/21/16 MRA head  - severe focal stenosis of the right posterior cerebral artery in its midportion and moderate stenosis of cavernous segment of right internal carotid artery. Stable 2 mm dilatation of the distal left cavernous internal carotid artery may represent infundibulum versus aneurysm. No significant change compared with MRA dated 09/16/15.  11/02/16 MRI brain  Mildly abnormal MRI brain (without)  demonstrating: 1. Chronic right occipital pole ischemic infarction. 2. Mild chronic small vessel ischemic disease. 3. Left frontal extra-axial, irregular ossification projecting from the inner table of the skull. May represent focal osteoma vs calcified meningioma. 4. No acute findings.     ASSESSMENT AND PLAN  66 y.o. year old female here with right occipital ischemic infarction, which appears to be embolic based on imaging characteristics. MRA head shows multiple areas of intracranial atherosclerosis, including right P2 stenosis (which is likely symptomatic and the cause of stroke), and related to underlying uncontrolled hypertension. Also with tiny unruptured cerebral aneurysm (asymptomatic) which is stable from 2017 - 2018.   Now with increased vision constriction on vision field testing per ophthalmology in March 2018, but no subjective changes per patient. Now improved on last visit to eye doctor.    Dx:  1. Occipital stroke ([place])   2. Unruptured cerebral aneurysm   3. Essential hypertension   4. RLS (restless legs syndrome)      PLAN:  - continue aspirin 81mg  daily - continue valsartan 80mg  daily + amlodipine 2.5mg  at bedtime - follow up with Dr. Forde Dandy diabetes and lipid screening/treatment; patient was intolerant of statin apparently, but may be candidate for a different statin, ezetimibe or PCSK9 inhibitors - repeat MRA head in January 2020 to follow up aneurysm and intracranial stenoses - stroke warning signs and education reviewed  Return in about 1 year (around 06/15/2018) for with NP .   Penni Bombard, MD 94/70/9628, 3:66 PM Certified in Neurology, Neurophysiology and Neuroimaging  [facility] [address], [place], [place] [address] [phone]

## 2017-07-25 DIAGNOSIS — G4761 Periodic limb movement disorder: Secondary | ICD-10-CM | POA: Diagnosis not present

## 2017-07-25 DIAGNOSIS — E7849 Other hyperlipidemia: Secondary | ICD-10-CM | POA: Diagnosis not present

## 2017-07-25 DIAGNOSIS — Z6822 Body mass index (BMI) 22.0-22.9, adult: Secondary | ICD-10-CM | POA: Diagnosis not present

## 2017-07-25 DIAGNOSIS — E890 Postprocedural hypothyroidism: Secondary | ICD-10-CM | POA: Diagnosis not present

## 2017-07-25 DIAGNOSIS — D126 Benign neoplasm of colon, unspecified: Secondary | ICD-10-CM | POA: Diagnosis not present

## 2017-07-25 DIAGNOSIS — I6312 Cerebral infarction due to embolism of basilar artery: Secondary | ICD-10-CM | POA: Diagnosis not present

## 2017-07-25 DIAGNOSIS — I10 Essential (primary) hypertension: Secondary | ICD-10-CM | POA: Diagnosis not present

## 2017-07-25 DIAGNOSIS — M810 Age-related osteoporosis without current pathological fracture: Secondary | ICD-10-CM | POA: Diagnosis not present

## 2017-07-25 DIAGNOSIS — Z1389 Encounter for screening for other disorder: Secondary | ICD-10-CM | POA: Diagnosis not present

## 2017-07-25 DIAGNOSIS — E559 Vitamin D deficiency, unspecified: Secondary | ICD-10-CM | POA: Diagnosis not present

## 2017-07-26 DIAGNOSIS — H5340 Unspecified visual field defects: Secondary | ICD-10-CM | POA: Diagnosis not present

## 2017-07-26 DIAGNOSIS — H353131 Nonexudative age-related macular degeneration, bilateral, early dry stage: Secondary | ICD-10-CM | POA: Diagnosis not present

## 2017-08-08 DIAGNOSIS — R300 Dysuria: Secondary | ICD-10-CM | POA: Diagnosis not present

## 2017-08-08 DIAGNOSIS — N390 Urinary tract infection, site not specified: Secondary | ICD-10-CM | POA: Diagnosis not present

## 2017-10-03 DIAGNOSIS — M79672 Pain in left foot: Secondary | ICD-10-CM | POA: Diagnosis not present

## 2017-10-17 ENCOUNTER — Other Ambulatory Visit: Payer: Self-pay | Admitting: Obstetrics and Gynecology

## 2017-10-17 DIAGNOSIS — Z1231 Encounter for screening mammogram for malignant neoplasm of breast: Secondary | ICD-10-CM

## 2017-11-10 ENCOUNTER — Ambulatory Visit: Payer: PPO

## 2017-12-01 ENCOUNTER — Ambulatory Visit
Admission: RE | Admit: 2017-12-01 | Discharge: 2017-12-01 | Disposition: A | Discharge status: Home / Self Care | Payer: PPO | Source: Ambulatory Visit | Attending: Obstetrics and Gynecology | Admitting: Obstetrics and Gynecology

## 2017-12-01 DIAGNOSIS — Z1231 Encounter for screening mammogram for malignant neoplasm of breast: Secondary | ICD-10-CM

## 2017-12-06 DIAGNOSIS — Z01419 Encounter for gynecological examination (general) (routine) without abnormal findings: Secondary | ICD-10-CM | POA: Diagnosis not present

## 2018-01-25 DIAGNOSIS — Z1389 Encounter for screening for other disorder: Secondary | ICD-10-CM | POA: Diagnosis not present

## 2018-01-25 DIAGNOSIS — E7849 Other hyperlipidemia: Secondary | ICD-10-CM | POA: Diagnosis not present

## 2018-01-25 DIAGNOSIS — I10 Essential (primary) hypertension: Secondary | ICD-10-CM | POA: Diagnosis not present

## 2018-01-25 DIAGNOSIS — H3530 Unspecified macular degeneration: Secondary | ICD-10-CM | POA: Diagnosis not present

## 2018-01-25 DIAGNOSIS — M810 Age-related osteoporosis without current pathological fracture: Secondary | ICD-10-CM | POA: Diagnosis not present

## 2018-01-25 DIAGNOSIS — D126 Benign neoplasm of colon, unspecified: Secondary | ICD-10-CM | POA: Diagnosis not present

## 2018-01-25 DIAGNOSIS — E559 Vitamin D deficiency, unspecified: Secondary | ICD-10-CM | POA: Diagnosis not present

## 2018-01-25 DIAGNOSIS — E210 Primary hyperparathyroidism: Secondary | ICD-10-CM | POA: Diagnosis not present

## 2018-01-25 DIAGNOSIS — Z6822 Body mass index (BMI) 22.0-22.9, adult: Secondary | ICD-10-CM | POA: Diagnosis not present

## 2018-01-25 DIAGNOSIS — E890 Postprocedural hypothyroidism: Secondary | ICD-10-CM | POA: Diagnosis not present

## 2018-01-25 DIAGNOSIS — I6389 Other cerebral infarction: Secondary | ICD-10-CM | POA: Diagnosis not present

## 2018-02-08 DIAGNOSIS — I6349 Cerebral infarction due to embolism of other cerebral artery: Secondary | ICD-10-CM | POA: Diagnosis not present

## 2018-02-08 DIAGNOSIS — H2513 Age-related nuclear cataract, bilateral: Secondary | ICD-10-CM | POA: Diagnosis not present

## 2018-02-08 DIAGNOSIS — H353131 Nonexudative age-related macular degeneration, bilateral, early dry stage: Secondary | ICD-10-CM | POA: Diagnosis not present

## 2018-06-09 DIAGNOSIS — L821 Other seborrheic keratosis: Secondary | ICD-10-CM | POA: Diagnosis not present

## 2018-06-09 DIAGNOSIS — L570 Actinic keratosis: Secondary | ICD-10-CM | POA: Diagnosis not present

## 2018-06-19 ENCOUNTER — Ambulatory Visit: Payer: PPO | Admitting: Adult Health

## 2018-06-23 DIAGNOSIS — R4702 Dysphasia: Secondary | ICD-10-CM | POA: Diagnosis not present

## 2018-06-23 DIAGNOSIS — W19XXXA Unspecified fall, initial encounter: Secondary | ICD-10-CM | POA: Diagnosis not present

## 2018-06-23 DIAGNOSIS — I639 Cerebral infarction, unspecified: Secondary | ICD-10-CM | POA: Diagnosis not present

## 2018-06-23 DIAGNOSIS — R000 Tachycardia, unspecified: Secondary | ICD-10-CM | POA: Diagnosis not present

## 2018-06-23 DIAGNOSIS — R4020 Unspecified coma: Secondary | ICD-10-CM | POA: Diagnosis not present

## 2018-06-24 ENCOUNTER — Inpatient Hospital Stay (HOSPITAL_COMMUNITY): Payer: PPO

## 2018-06-24 ENCOUNTER — Inpatient Hospital Stay (HOSPITAL_COMMUNITY)
Admission: EM | Admit: 2018-06-24 | Discharge: 2018-06-25 | DRG: 065 | Disposition: A | Discharge status: Home / Self Care | Payer: PPO | Source: Other Acute Inpatient Hospital | Attending: Neurology | Admitting: Neurology

## 2018-06-24 DIAGNOSIS — Z7982 Long term (current) use of aspirin: Secondary | ICD-10-CM

## 2018-06-24 DIAGNOSIS — I340 Nonrheumatic mitral (valve) insufficiency: Secondary | ICD-10-CM | POA: Diagnosis not present

## 2018-06-24 DIAGNOSIS — Z79899 Other long term (current) drug therapy: Secondary | ICD-10-CM | POA: Diagnosis not present

## 2018-06-24 DIAGNOSIS — R402142 Coma scale, eyes open, spontaneous, at arrival to emergency department: Secondary | ICD-10-CM | POA: Diagnosis not present

## 2018-06-24 DIAGNOSIS — I361 Nonrheumatic tricuspid (valve) insufficiency: Secondary | ICD-10-CM | POA: Diagnosis not present

## 2018-06-24 DIAGNOSIS — I6300 Cerebral infarction due to thrombosis of unspecified precerebral artery: Secondary | ICD-10-CM | POA: Diagnosis not present

## 2018-06-24 DIAGNOSIS — I69320 Aphasia following cerebral infarction: Secondary | ICD-10-CM | POA: Diagnosis not present

## 2018-06-24 DIAGNOSIS — Z9282 Status post administration of tPA (rtPA) in a different facility within the last 24 hours prior to admission to current facility: Secondary | ICD-10-CM | POA: Diagnosis not present

## 2018-06-24 DIAGNOSIS — R402362 Coma scale, best motor response, obeys commands, at arrival to emergency department: Secondary | ICD-10-CM | POA: Diagnosis present

## 2018-06-24 DIAGNOSIS — H5340 Unspecified visual field defects: Secondary | ICD-10-CM | POA: Diagnosis not present

## 2018-06-24 DIAGNOSIS — R402252 Coma scale, best verbal response, oriented, at arrival to emergency department: Secondary | ICD-10-CM | POA: Diagnosis present

## 2018-06-24 DIAGNOSIS — R29700 NIHSS score 0: Secondary | ICD-10-CM | POA: Diagnosis present

## 2018-06-24 DIAGNOSIS — I10 Essential (primary) hypertension: Secondary | ICD-10-CM | POA: Diagnosis not present

## 2018-06-24 DIAGNOSIS — I639 Cerebral infarction, unspecified: Secondary | ICD-10-CM

## 2018-06-24 DIAGNOSIS — Z7951 Long term (current) use of inhaled steroids: Secondary | ICD-10-CM

## 2018-06-24 DIAGNOSIS — E785 Hyperlipidemia, unspecified: Secondary | ICD-10-CM | POA: Diagnosis not present

## 2018-06-24 DIAGNOSIS — I63531 Cerebral infarction due to unspecified occlusion or stenosis of right posterior cerebral artery: Secondary | ICD-10-CM | POA: Diagnosis not present

## 2018-06-24 DIAGNOSIS — I63512 Cerebral infarction due to unspecified occlusion or stenosis of left middle cerebral artery: Secondary | ICD-10-CM

## 2018-06-24 DIAGNOSIS — Z87891 Personal history of nicotine dependence: Secondary | ICD-10-CM | POA: Diagnosis not present

## 2018-06-24 DIAGNOSIS — E038 Other specified hypothyroidism: Secondary | ICD-10-CM

## 2018-06-24 LAB — LIPID PANEL
CHOLESTEROL: 187 mg/dL (ref 0–200)
HDL: 51 mg/dL (ref >40)
LDL Cholesterol: 125 mg/dL — ABNORMAL HIGH (ref 0–99)
TRIGLYCERIDES: 55 mg/dL (ref <150)
Total CHOL/HDL Ratio: 3.7 RATIO
VLDL: 11 mg/dL (ref 0–40)

## 2018-06-24 LAB — HEMOGLOBIN A1C
Hgb A1c MFr Bld: 5.1 % (ref 4.8–5.6)
Mean Plasma Glucose: 99.67 mg/dL

## 2018-06-24 LAB — MRSA PCR SCREENING: MRSA by PCR: NEGATIVE

## 2018-06-24 LAB — HIV ANTIBODY (ROUTINE TESTING W REFLEX): HIV Screen 4th Generation wRfx: NONREACTIVE

## 2018-06-24 MED ORDER — PANTOPRAZOLE SODIUM 40 MG IV SOLR
40.0000 mg | Freq: Every day | INTRAVENOUS | Status: DC
  Administered 2018-06-24: 40 mg via INTRAVENOUS
  Filled 2018-06-24: qty 40

## 2018-06-24 MED ORDER — LEVOTHYROXINE SODIUM 112 MCG PO TABS
112.0000 ug | ORAL_TABLET | Freq: Every day | ORAL | Status: DC
  Administered 2018-06-25: 112 ug via ORAL
  Filled 2018-06-24 (×2): qty 1

## 2018-06-24 MED ORDER — ATORVASTATIN CALCIUM 40 MG PO TABS
40.0000 mg | ORAL_TABLET | Freq: Every day | ORAL | Status: DC
  Administered 2018-06-24: 40 mg via ORAL
  Filled 2018-06-24: qty 1

## 2018-06-24 MED ORDER — ACETAMINOPHEN 160 MG/5ML PO SOLN: 650.0000 mg | ORAL | Status: DC | PRN

## 2018-06-24 MED ORDER — ACETAMINOPHEN 325 MG PO TABS: 650.0000 mg | ORAL_TABLET | ORAL | Status: DC | PRN

## 2018-06-24 MED ORDER — SENNOSIDES-DOCUSATE SODIUM 8.6-50 MG PO TABS: 1.0000 | ORAL_TABLET | Freq: Every evening | ORAL | Status: DC | PRN

## 2018-06-24 MED ORDER — STROKE: EARLY STAGES OF RECOVERY BOOK
Freq: Once | Status: AC
  Administered 2018-06-24: 05:00:00

## 2018-06-24 MED ORDER — ACETAMINOPHEN 650 MG RE SUPP: 650.0000 mg | RECTAL | Status: DC | PRN

## 2018-06-24 MED ORDER — ASPIRIN 325 MG PO TABS
325.0000 mg | ORAL_TABLET | Freq: Every day | ORAL | Status: DC
  Administered 2018-06-24 – 2018-06-25 (×2): 325 mg via ORAL
  Filled 2018-06-24 (×2): qty 1

## 2018-06-24 MED ORDER — SODIUM CHLORIDE 0.9 % IV SOLN
INTRAVENOUS | Status: DC
  Administered 2018-06-24 (×2): via INTRAVENOUS

## 2018-06-24 NOTE — H&P (Signed)
Chief Complaint: Aphasia  History obtained from: Patient and Chart   HPI:                                                                                                                                       Bethany Ross is an 67 y.o. female with  past medical history of right PCA stroke, hypertension who presented to [facility] as a stroke alert for sudden onset aphasia.  Patient was at a Christmas party when he suddenly felt she was unable to speak.  Her husband was with her gradually sat her down to a chair and he felt she was going limp.  Patient recalls EMS arriving and asking her questions but unable to get her words out.  She did not have any facial droop or right-sided weakness.  She was taken to [facility] emergency room and stroke alerted. Teleneurology was consulted and she scored 4 on the NIH stroke scale and received IV TPA. Her symptoms improved in about 5 minutes, almost back to her baseline.  He was transferred to [facility] for further management as she received IV TPA.  Patient states she is no longer taking aspirin has not been since about 1 year.  She sees Dr. Leta Baptist for CVA in 2017 which left her with mild left visual field cut.  Date last known well: 12.6.19 Time last known well: 8pm tPA Given: yes NIHSS: 0 Baseline MRS 1   Past Medical History:  Diagnosis Date  . Cerebral infarction ([place])   . Hypertension     Past Surgical History:  Procedure Laterality Date  . BREAST BIOPSY Left    benign  . BREAST EXCISIONAL BIOPSY Left    benign  . THYROIDECTOMY  1998  . TONSILLECTOMY     as child    Family History  Problem Relation Age of Onset  . Cancer - Cervical Mother   . Cancer Father        esophageal   Social History:  reports that she quit smoking about 21 years ago. She has never used smokeless tobacco. She reports that she drinks alcohol. She reports that she does not use drugs.  Allergies: No Known Allergies  Medications:  I reviewed home medications   ROS:                                                                                                                                     14 systems reviewed and negative except above    Examination:                                                                                                      General: Appears well-developed  Psych: Affect appropriate to situation Eyes: No scleral injection HENT: No OP obstrucion Head: Normocephalic.  Cardiovascular: Normal rate and regular rhythm.  Respiratory: Effort normal and breath sounds normal to anterior ascultation GI: Soft.  No distension. There is no tenderness.  Skin: WDI    Neurological Examination Mental Status: Alert, oriented, thought content appropriate.  Speech fluent without evidence of aphasia. Able to follow 3 step commands without difficulty. Cranial Nerves: II: Visual fields grossly normal, no obvious left eye quadrantanopsia on examination III,IV, VI: ptosis not present, extra-ocular motions intact bilaterally, pupils equal, round, reactive to light and accommodation V,VII: smile symmetric, facial light touch sensation normal bilaterally VIII: hearing normal bilaterally IX,X: uvula rises symmetrically XI: bilateral shoulder shrug XII: midline tongue extension Motor: Right : Upper extremity   5/5    Left:     Upper extremity   5/5  Lower extremity   5/5     Lower extremity   5/5 Tone and bulk:normal tone throughout; no atrophy noted Sensory: Pinprick and light touch intact throughout, bilaterally Deep Tendon Reflexes: 2+ and symmetric throughout Plantars: Right: downgoing   Left: downgoing Cerebellar: normal finger-to-nose, normal rapid alternating movements and normal heel-to-shin test Gait: normal gait and station     Lab Results: Basic Metabolic Panel: No results for  input(s): NA, K, CL, CO2, GLUCOSE, BUN, CREATININE, CALCIUM, MG, PHOS in the last 168 hours.  CBC: No results for input(s): WBC, NEUTROABS, HGB, HCT, MCV, PLT in the last 168 hours.  Coagulation Studies: No results for input(s): LABPROT, INR in the last 72 hours.  Imaging: No results found.   ASSESSMENT AND PLAN  67 year old female with past medical history of hypertension, CVA currently not on any aspirin or statin mid to neuro ICU after receiving IV TPA at outside hospital for aphasia. CT angiogram showed severely stenotic versus partially recanalized (less likely) left MCA occlusion.  Patient responded well to IV TPA administration and is currently back to her baseline.  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, however pending work-up  # MRI of the brain without contrast  #CTA Head ( completed) #Transthoracic Echo  #Hold antiplatelets until 24 hours from TPA administration, will likely need to be on dual antiplatelets for intracranial atherosclerotic disease #Start  Atorvastatin 40 mg/other high intensity statin # BP goal: permissive HTN upto 220/120 mmHg ( 185/110 if patient has CHF, CKD) # HBAIC and Lipid profile # Telemetry monitoring # Frequent neuro checks # Passed swallow screen    HTN  -permissive HTN upto 180/105 mmHg   Full Code DVT PPX; SCD  Please page stroke NP  Or  PA  Or MD from 8am -4 pm  as this patient from this time will be  followed by the stroke.   You can look them up on [email or web address]  Password [email or web address]    This patient is neurologically critically ill due to stroke s/p tPA.  She is at risk for significant risk of neurological worsening from cerebral edema,  death from brain herniation, heart failure, hemorrhagic conversion, infection, respiratory failure and seizure. This patient's care requires constant monitoring of vital signs,  hemodynamics, respiratory and cardiac monitoring, review of multiple databases, neurological assessment, discussion with family, other specialists and medical decision making of high complexity.  I spent 50  minutes of neurocritical time in the care of this patient.      Shamere Campas [facility] Pager Number [phone]

## 2018-06-24 NOTE — Evaluation (Signed)
Physical Therapy Evaluation and D/C Patient Details Name: Bethany Ross MRN: [id number] DOB: 1950-11-08 Today's Date: 06/24/2018   History of Present Illness  Bethany Ross is an 67 y.o. female with  past medical history of right PCA stroke, hypertension who presented to [facility] as a stroke alert for sudden onset aphasia.  Received tPa at [facility]. PMH: smoker, ETOH, TIA  Clinical Impression  Pt admitted with above diagnosis. Pt currently without significant functional limitations and is independent with mobility.  Does not appear to have any residual deficits.  Will sign off as pt has no PT needs.    Follow Up Recommendations No PT follow up    Equipment Recommendations  None recommended by PT    Recommendations for Other Services       Precautions / Restrictions Precautions Precautions: None Restrictions Weight Bearing Restrictions: No      Mobility  Bed Mobility Overal bed mobility: Independent                Transfers Overall transfer level: Independent                  Ambulation/Gait Ambulation/Gait assistance: Supervision Gait Distance (Feet): 550 Feet Assistive device: None Gait Pattern/deviations: Step-through pattern;WFL(Within Functional Limits)   Gait velocity interpretation: >2.62 ft/sec, indicative of community ambulatory General Gait Details: Pt feels like she is very close to her baseline with all symptoms resolved. Can accept challenges to balance.    Stairs            Wheelchair Mobility    Modified Rankin (Stroke Patients Only) Modified Rankin (Stroke Patients Only) Pre-Morbid Rankin Score: No symptoms Modified Rankin: No significant disability     Balance Overall balance assessment: Independent                               Standardized Balance Assessment Standardized Balance Assessment : Dynamic Gait Index   Dynamic Gait Index Level Surface: Normal Change in Gait Speed:  Normal Gait with Horizontal Head Turns: Normal Gait with Vertical Head Turns: Normal Gait and Pivot Turn: Normal Step Over Obstacle: Normal Step Around Obstacles: Normal Steps: Normal Total Score: 24       Pertinent Vitals/Pain Pain Assessment: No/denies pain    Home Living Family/patient expects to be discharged to:: Private residence Living Arrangements: Spouse/significant other Available Help at Discharge: Family;Available 24 hours/day Type of Home: House Home Access: Stairs to enter Entrance Stairs-Rails: Right Entrance Stairs-Number of Steps: 4 Home Layout: One level Home Equipment: None      Prior Function Level of Independence: Independent         Comments: Retired, drives, was walking dog yesterday     Journalist, newspaper        Extremity/Trunk Assessment   Upper Extremity Assessment Upper Extremity Assessment: Defer to OT evaluation    Lower Extremity Assessment Lower Extremity Assessment: Overall WFL for tasks assessed    Cervical / Trunk Assessment Cervical / Trunk Assessment: Normal  Communication   Communication: No difficulties  Cognition Arousal/Alertness: Awake/alert Behavior During Therapy: WFL for tasks assessed/performed Overall Cognitive Status: Within Functional Limits for tasks assessed                                        General Comments      Exercises  Assessment/Plan    PT Assessment Patent does not need any further PT services  PT Problem List         PT Treatment Interventions      PT Goals (Current goals can be found in the Care Plan section)  Acute Rehab PT Goals Patient Stated Goal: to go home PT Goal Formulation: All assessment and education complete, DC therapy    Frequency     Barriers to discharge        Co-evaluation               AM-PAC PT "6 Clicks" Mobility  Outcome Measure Help needed turning from your back to your side while in a flat bed without using bedrails?:  None Help needed moving from lying on your back to sitting on the side of a flat bed without using bedrails?: None Help needed moving to and from a bed to a chair (including a wheelchair)?: None Help needed standing up from a chair using your arms (e.g., wheelchair or bedside chair)?: None Help needed to walk in hospital room?: None Help needed climbing 3-5 steps with a railing? : None 6 Click Score: 24    End of Session Equipment Utilized During Treatment: Gait belt Activity Tolerance: Patient tolerated treatment well Patient left: in chair;with call bell/phone within reach;with chair alarm set;with family/visitor present Nurse Communication: Mobility status PT Visit Diagnosis: Muscle weakness (generalized) (M62.81)    Time: 9233-0076 PT Time Calculation (min) (ACUTE ONLY): 20 min   Charges:   PT Evaluation $PT Eval Low Complexity: 1 Low          Haizlee Henton,PT Acute Rehabilitation Services Pager:  [phone]  Office:  [place] 06/24/2018, 4:25 PM

## 2018-06-24 NOTE — Progress Notes (Signed)
STROKE TEAM PROGRESS NOTE   HISTORY OF PRESENT ILLNESS (per record) Bethany Ross is an 67 y.o. female with  past medical history of right PCA stroke, hypertension who presented to [facility] as a stroke alert for sudden onset aphasia.  Patient was at a Christmas party when he suddenly felt she was unable to speak.  Her husband was with her gradually sat her down to a chair and he felt she was going limp.  Patient recalls EMS arriving and asking her questions but unable to get her words out.  She did not have any facial droop or right-sided weakness.  She was taken to [facility] emergency room and stroke alerted. Teleneurology was consulted and she scored 4 on the NIH stroke scale and received IV TPA. Her symptoms improved in about 5 minutes, almost back to her baseline.  He was transferred to [facility] for further management as she received IV TPA.  Patient states she is no longer taking aspirin has not been since about 1 year.  She sees Dr. Leta Baptist for CVA in 2017 which left her with mild left visual field cut.  Date last known well: 12.6.19 Time last known well: 8pm tPA Given: yes NIHSS: 0 Baseline MRS 1   SUBJECTIVE (INTERVAL HISTORY) No family present. Pt states speech returned soon after tPA. Back to baseline.Her blood pressure is at the quickly controlled. She remains neurologically stable. 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  Basic Metabolic Panel: No results for input(s): NA, K, CL, CO2, GLUCOSE, BUN, CREATININE, CALCIUM, MG, PHOS in the last 168  hours.  Lipid Panel:     Component Value Date/Time   CHOL 187 06/24/2018 0649   CHOL 206 (H) 10/13/2016 1000   TRIG 55 06/24/2018 0649   HDL 51 06/24/2018 0649   HDL 63 10/13/2016 1000   CHOLHDL 3.7 06/24/2018 0649   VLDL 11 06/24/2018 0649   LDLCALC 125 (H) 06/24/2018 0649   LDLCALC 128 (H) 10/13/2016 1000   HgbA1c:  Lab Results  Component Value Date   HGBA1C 5.1 06/24/2018   Urine Drug Screen: No results found for: LABOPIA, COCAINSCRNUR, LABBENZ, AMPHETMU, THCU, LABBARB  Alcohol Level No results found for: ETH  IMAGING  MRI Brain WO Contrast - pending   CT Head WO Contrast - pending   Transthoracic Echocardiogram - pending 00/00/00    EEG - pending      PHYSICAL EXAM Blood pressure (!) 116/56, pulse 61, temperature 98.3 F (36.8 C), temperature source Oral, resp. rate 18, height 5\' 7"  (1.702 m), weight 63.7 kg, SpO2 99 %.  Pleasant middle aged 38 lady not in distress.  . Afebrile. Head is nontraumatic. Neck is supple without bruit.    Cardiac exam no murmur or gallop. Lungs are clear to auscultation. Distal pulses are well felt. Neurological Exam ;  Awake  Alert oriented x 3. Normal speech and language.eye movements full without nystagmus.fundi were  not visualized. Vision acuity and fields appear normal. Hearing is normal. Palatal movements are normal. Face symmetric. Tongue midline. Normal strength, tone, reflexes and coordination. Normal sensation. Gait deferred.    ASSESSMENT/PLAN Ms. Bethany Ross is a 67 y.o. female with history of CVA in 2017 which left her with mild left visual field cut followed by Dr. Leta Baptist and Htn presenting with sudden onset aphasia. IV tPA at [facility] per tele neurology   Stroke:  MRI pending  Resultant - improved after tPA  CT head - pending  MRI head - pending  MRA head - not performed  CTA Head (OSH) - severely stenotic versus partially recanalized (less likely) left MCA occlusion.    Carotid Doppler - will order  EEG - pending  2D Echo - pending  LDL - 125  HgbA1c - 5.1  UDS - not performed  VTE prophylaxis - SCDs  Diet -  - Heart healthy with thin liquids.  aspirin 325 mg daily prior to admission, now on No antithrombotic S/P TPA  Patient counseled to be compliant with her antithrombotic medications  Ongoing aggressive stroke risk factor management  Therapy recommendations:  pending  Disposition:  Pending  Hypertension  Stable . Permissive hypertension (OK if < 220/120) but gradually normalize in 5-7 days . Long-term BP goal normotensive  Hyperlipidemia  Lipid lowering medication PTA: none  LDL 125, goal < 70  Current lipid lowering medication: Start Lipitor 40 mg daily  Continue statin at discharge    Other Stroke Risk Factors  Advanced age  Former cigarette smoker - quit  ETOH use, advised to drink no more than 1 alcoholic beverage per day.  Hx stroke/TIA   Other Active Problems  Hypothyroidism  Plan  Consider ASA and Plavix for 3 weeks after MRI or CT  Resume synthroid  Increase activity  Start Lipitor   Hospital day # 0  Mikey Bussing PA-C [facility] Pager [phone] 06/24/2018, 1:56 PM I have personally taken detailed history of presentation,examined this patient, reviewed notes, independently viewed imaging studies, participated in medical decision making and plan of care.ROS completed by me personally and pertinent positives fully documented  I have made any additions or clarifications directly to the above note. Agree with note above. .she presented with transient dramatic improvement after IV tPA.recommend close neurological monitoring and strict blood pressure control as per post TPA protocol. Check MRI scan and transferred out of ICU later today if stable. No family available at the bedside for discussion.This patient is critically ill and at significant risk of neurological worsening,  death and care requires constant monitoring of vital signs, hemodynamics,respiratory and cardiac monitoring, extensive review of multiple databases, frequent neurological assessment, discussion with family, other specialists and medical decision making of high complexity.I have made any additions or clarifications directly to the above note.This critical care time does not reflect procedure time, or teaching time or supervisory time of PA/NP/Med Resident etc but could involve care discussion time.  I spent 30 minutes of neurocritical care time  in the care of  this patient.      Antony Contras, MD Medical Director [facility] Stroke Center Pager: [phone] 06/24/2018 3:36 PM   To contact Stroke Continuity provider, please refer to [email or web address] After hours, contact General Neurology

## 2018-06-24 NOTE — Progress Notes (Signed)
PT Cancellation Note  Patient Details Name: KEIRSTEN MATUSKA MRN: [id number] DOB: 11-01-1950   Cancelled Treatment:    Reason Eval/Treat Not Completed: Medical issues which prohibited therapy(Active bedrest order. Will check back when appropriate. )Thanks.    Denice Paradise 06/24/2018, 8:48 AM Derak Schurman,PT Acute Rehabilitation Services Pager:  [phone]  Office:  [phone]

## 2018-06-24 NOTE — Progress Notes (Signed)
OT Cancellation Note  Patient Details Name: Bethany Ross MRN: [id number] DOB: Jan 08, 1951   Cancelled Treatment:    Reason Eval/Treat Not Completed: Active bedrest order. Pt with active bedrest orders. Will await increase in activity orders prior to initiating OT evaluation. Thank you.  [place], OTR/L Acute Rehab Pager: [phone] Office: [phone] 06/24/2018, 8:02 AM

## 2018-06-24 NOTE — Plan of Care (Signed)
  Problem: Coping: Goal: Will verbalize positive feelings about self Outcome: Progressing   Problem: Nutrition: Goal: Dietary intake will improve Outcome: Progressing

## 2018-06-25 ENCOUNTER — Inpatient Hospital Stay (HOSPITAL_COMMUNITY): Payer: PPO

## 2018-06-25 DIAGNOSIS — I340 Nonrheumatic mitral (valve) insufficiency: Secondary | ICD-10-CM

## 2018-06-25 DIAGNOSIS — I361 Nonrheumatic tricuspid (valve) insufficiency: Secondary | ICD-10-CM

## 2018-06-25 DIAGNOSIS — I6300 Cerebral infarction due to thrombosis of unspecified precerebral artery: Secondary | ICD-10-CM

## 2018-06-25 LAB — ECHOCARDIOGRAM COMPLETE
Height: 67 in
Weight: 2246.93 oz

## 2018-06-25 MED ORDER — ATORVASTATIN CALCIUM 40 MG PO TABS: 40.0000 mg | ORAL_TABLET | Freq: Every day | ORAL | 3 refills | Status: AC

## 2018-06-25 MED ORDER — CLOPIDOGREL BISULFATE 75 MG PO TABS: 75.0000 mg | ORAL_TABLET | Freq: Every day | ORAL | 3 refills | Status: DC

## 2018-06-25 MED ORDER — ASPIRIN 81 MG PO TBEC: 81.0000 mg | DELAYED_RELEASE_TABLET | Freq: Every day | ORAL | Status: AC

## 2018-06-25 MED ORDER — ASPIRIN EC 81 MG PO TBEC: 81.0000 mg | DELAYED_RELEASE_TABLET | Freq: Every day | ORAL | Status: DC

## 2018-06-25 MED ORDER — CLOPIDOGREL BISULFATE 75 MG PO TABS: 75.0000 mg | ORAL_TABLET | Freq: Every day | ORAL | Status: DC

## 2018-06-25 NOTE — Discharge Summary (Addendum)
Patient ID: Bethany Ross   MRN: [id number]      DOB: 05-06-1951  Date of Admission: 06/24/2018 Date of Discharge: 06/25/2018  Attending Physician:  Garvin Fila, MD, Stroke MD Consultant(s):    None  Patient's PCP:  Reynold Bowen, MD  DISCHARGE DIAGNOSIS:  5 mm acute ischemic infarcts involving the posterior left frontal lobe likely from small vessel disease treated with TPA. Active Problems:   Stroke (cerebrum) [facility])   Past Medical History:  Diagnosis Date  . Cerebral infarction ([place])   . Hypertension    Past Surgical History:  Procedure Laterality Date  . BREAST BIOPSY Left    benign  . BREAST EXCISIONAL BIOPSY Left    benign  . THYROIDECTOMY  1998  . TONSILLECTOMY     as child    Allergies as of 06/25/2018   No Known Allergies     Medication List    STOP taking these medications   aspirin 325 MG tablet Replaced by:  aspirin 81 MG EC tablet   valsartan 80 MG tablet Commonly known as:  DIOVAN     TAKE these medications   amLODipine 2.5 MG tablet Commonly known as:  NORVASC Take 2.5 mg by mouth at bedtime.   aspirin 81 MG EC tablet Take 1 tablet (81 mg total) by mouth daily. Start taking on:  06/26/2018 Replaces:  aspirin 325 MG tablet   atorvastatin 40 MG tablet Commonly known as:  LIPITOR Take 1 tablet (40 mg total) by mouth daily at 6 PM.   clopidogrel 75 MG tablet Commonly known as:  PLAVIX Take 1 tablet (75 mg total) by mouth daily.   levothyroxine 137 MCG tablet Commonly known as:  SYNTHROID, LEVOTHROID Take 137 mcg by mouth daily before breakfast. What changed:  Another medication with the same name was removed. Continue taking this medication, and follow the directions you see here.   losartan 50 MG tablet Commonly known as:  COZAAR Take 50 mg by mouth daily.       HOME MEDICATIONS PRIOR TO ADMISSION Medications Prior to Admission  Medication Sig Dispense Refill  . levothyroxine (SYNTHROID, LEVOTHROID) 137 MCG tablet Take  137 mcg by mouth daily before breakfast.    . losartan (COZAAR) 50 MG tablet Take 50 mg by mouth daily.  2  . amLODipine (NORVASC) 2.5 MG tablet Take 2.5 mg by mouth at bedtime.   5  . aspirin 325 MG tablet Take 325 mg by mouth daily.    Marland Kitchen levothyroxine (SYNTHROID, LEVOTHROID) 112 MCG tablet Take 112 mcg by mouth daily before breakfast.    . valsartan (DIOVAN) 80 MG tablet 80 mg daily.  [place] . [START ON 06/26/2018] aspirin EC  81 mg Oral Daily  . atorvastatin  40 mg Oral q1800  . clopidogrel  75 mg Oral Daily  . levothyroxine  112 mcg Oral Q0600  . pantoprazole (PROTONIX) IV  40 mg Intravenous QHS    LABORATORY STUDIES CBC No results found for: WBC, RBC, HGB, HCT, PLT, MCV, MCH, MCHC, RDW, LYMPHSABS, MONOABS, EOSABS, BASOSABS CMPNo results found for: NA, K, CL, CO2, GLUCOSE, BUN, CREATININE, CALCIUM, PROT, ALBUMIN, AST, ALT, ALKPHOS, BILITOT, GFRNONAA, GFRAA COAGSNo results found for: INR, PROTIME Lipid Panel    Component Value Date/Time   CHOL 187 06/24/2018 0649   CHOL 206 (H) 10/13/2016 1000   TRIG 55 06/24/2018 0649   HDL 51 06/24/2018 0649   HDL 63 10/13/2016 1000  CHOLHDL 3.7 06/24/2018 0649   VLDL 11 06/24/2018 0649   LDLCALC 125 (H) 06/24/2018 0649   LDLCALC 128 (H) 10/13/2016 1000   HgbA1C  Lab Results  Component Value Date   HGBA1C 5.1 06/24/2018   Urinalysis No results found for: COLORURINE, APPEARANCEUR, LABSPEC, PHURINE, GLUCOSEU, HGBUR, BILIRUBINUR, KETONESUR, PROTEINUR, UROBILINOGEN, NITRITE, LEUKOCYTESUR Urine Drug Screen No results found for: LABOPIA, COCAINSCRNUR, LABBENZ, AMPHETMU, THCU, LABBARB  Alcohol Level No results found for: [facility]   SIGNIFICANT DIAGNOSTIC STUDIES  MRI Brain WO Contrast  06/24/2018 IMPRESSION: 1. Few punctate 5 mm acute ischemic infarcts involving the posterior left frontal lobe as above. No associated hemorrhage. 2. Small chronic right PCA territory infarct. 3. Underlying mild chronic microvascular  ischemic disease.  Transthoracic Echocardiogram  06/24/2018 Study Conclusions  - Left ventricle: The cavity size was normal. Wall thickness was   increased in a pattern of moderate LVH. Systolic function was   normal. The estimated ejection fraction was in the range of 60%   to 65%. Wall motion was normal; there were no regional wall   motion abnormalities. Features are consistent with a pseudonormal   left ventricular filling pattern, with concomitant abnormal   relaxation and increased filling pressure (grade 2 diastolic   dysfunction). Doppler parameters are consistent with   indeterminate ventricular filling pressure. - Mitral valve: There was mild to moderate regurgitation. - Atrial septum: No defect or patent foramen ovale was identified. - Tricuspid valve: There was mild regurgitation.   Carotid Dopplers 06/25/2018 Preliminary report:  No significant ICA stenosis.  Vertebral artery flow is antegrade.     HISTORY OF PRESENT ILLNESS ( From Dr Arooor's H&P) Bethany Takacs Radfordis an 67 y.o.femalewithpast medical history of right PCA stroke, [facility].  Patient was at a Christmas party when he suddenly felt she was unable to speak. Her husband was with her gradually sat her down to a chair and hefelt she was going limp.Patient recalls EMS arriving and asking her questions but unable to get her words out. She did not have any facial droop or right-sided weakness. She was taken to [facility] emergency room and stroke alerted. Teleneurology was consulted and she scored 4 on the NIH stroke scaleand receivedIV TPA. Her symptoms improved in about 5 minutes,almost back to her baseline.He was transferred to [facility] for further management as she received IV TPA.  Patient states she is no longer taking aspirin has not been since about 1 year. She sees Dr. Leta Baptist for CVA in 2017  which left her with mild left visual field cut.  Date last known well:12.6.19 Time last known well:8pm tPA Given:yes NIHSS:0 Baseline MRS1   HOSPITAL COURSE Ms. Bethany Ross is a 67 y.o. female with history of CVA in 2017 which left her with mild left visual field cut followed by Dr. Leta Baptist and Htn presenting with sudden onset aphasia. IV tPA at [facility] per tele neurology  Stroke:  5 mm acute ischemic infarcts involving the posterior left frontal lobe   Resultant - back to baseline after TPA  MRI head - Few punctate 5 mm acute ischemic infarcts involving the posterior left frontal lobe. Small chronic right PCA territory infarct.  CTA Head (OSH) - severely stenotic versus partially recanalized(less likely)left MCA occlusion.   Carotid Doppler - Preliminary report:  No significant ICA stenosis.  Vertebral artery flow is antegrade.  2D Echo  - EF 60 - 65%. No cardiac source of emboli  identified.   LDL - 125  HgbA1c - 5.1  VTE prophylaxis - SCDs  Diet -  - Heart healthy with thin liquids.  aspirin 325 mg daily prior to admission, now on No antithrombotic S/P TPA  Patient counseled to be compliant with her antithrombotic medications  Ongoing aggressive stroke risk factor management  Therapy recommendations: No F/U Physical Therapy recommended  Disposition: Discharge to home  Hypertension  Stable  Permissive hypertension (OK if < 220/120) but gradually normalize in 5-7 days  Long-term BP goal normotensive  Hyperlipidemia  Lipid lowering medication PTA: none  LDL 125, goal < 70  Current lipid lowering medication: Start Lipitor 40 mg daily  Continue statin at discharge    Other Stroke Risk Factors  Advanced age  Former cigarette smoker - quit  ETOH use, advised to drink no more than 1 alcoholic beverage per day.  Hx stroke/TIA   Other Active Problems  Hypothyroidism  Plan  ASA and Plavix for 3 weeks then aspirin  alone  Start Lipitor  F/U Dr Tish Frederickson      DISCHARGE EXAM Vitals:   06/25/18 0026 06/25/18 0354 06/25/18 0733 06/25/18 1226  BP: 102/78 (!) 126/48 (!) 143/71 (!) 153/72  Pulse: 64 60 73 62  Resp: 18 17 18 16   Temp: 98.6 F (37 C) 98.4 F (36.9 C) 98.2 F (36.8 C) 98 F (36.7 C)  TempSrc: Oral Oral Oral Oral  SpO2: 97% 98% 97% 97%  Weight:      Height:       Pleasant middle aged Caucasian lady not in distress.  . Afebrile. Head is nontraumatic. Neck is supple without bruit.    Cardiac exam no murmur or gallop. Lungs are clear to auscultation. Distal pulses are well felt. Neurological Exam ;  Awake  Alert oriented x 3. Normal speech and language.eye movements full without nystagmus.fundi were not visualized. Vision acuity and fields appear normal. Hearing is normal. Palatal movements are normal. Face symmetric. Tongue midline. Normal strength, tone, reflexes and coordination. Normal sensation. Gait deferred.    Discharge Diet    Diet Order            Diet Heart Room service appropriate? Yes; Fluid consistency: Thin  Diet effective now             liquids  DISCHARGE PLAN  Disposition:  Discharge to home  aspirin 81 mg daily and clopidogrel 75 mg daily for secondary stroke prevention for three weeks then aspirin alone.  Ongoing risk factor control by Primary Care Physician at time of discharge  Follow-up Reynold Bowen, MD in 2 weeks.  Follow-up in [facility] in 4 weeks, office to schedule an appointment.   35 minutes were spent preparing discharge.  Mikey Bussing PA-C [facility] Pager [phone] 06/25/2018, 1:20 PM I have personally obtained history,examined this patient, reviewed notes, independently viewed imaging studies, participated in medical decision making and plan of care.ROS completed by me personally and pertinent positives fully documented  I have made any additions or clarifications directly to  the above note. Agree with note above.  Antony Contras, MD Medical Director [facility] Stroke Center Pager: [phone] 06/25/2018 1:23 PM

## 2018-06-25 NOTE — Discharge Instructions (Signed)
1. Take aspirin 81 mg daily with Plavix 75 mg daily for 3 weeks. After 3 weeks stop the Plavix but continue to take aspirin 81 mg every day.

## 2018-06-25 NOTE — Progress Notes (Signed)
*  PRELIMINARY RESULTS* Echocardiogram 2D Echocardiogram has been performed.  Bethany Ross 06/25/2018, 11:46 AM

## 2018-06-25 NOTE — Progress Notes (Signed)
VASCULAR LAB PRELIMINARY  PRELIMINARY  PRELIMINARY  PRELIMINARY  Carotid duplex completed.    Preliminary report:  No significant ICA stenosis.  Vertebral artery flow is antegrade.   Elna Radovich, RVT 06/25/2018, 11:37 AM

## 2018-07-26 DIAGNOSIS — E890 Postprocedural hypothyroidism: Secondary | ICD-10-CM | POA: Diagnosis not present

## 2018-07-26 DIAGNOSIS — E7849 Other hyperlipidemia: Secondary | ICD-10-CM | POA: Diagnosis not present

## 2018-07-26 DIAGNOSIS — I5189 Other ill-defined heart diseases: Secondary | ICD-10-CM | POA: Diagnosis not present

## 2018-07-26 DIAGNOSIS — M810 Age-related osteoporosis without current pathological fracture: Secondary | ICD-10-CM | POA: Diagnosis not present

## 2018-07-26 DIAGNOSIS — I639 Cerebral infarction, unspecified: Secondary | ICD-10-CM | POA: Diagnosis not present

## 2018-07-26 DIAGNOSIS — I10 Essential (primary) hypertension: Secondary | ICD-10-CM | POA: Diagnosis not present

## 2018-07-26 DIAGNOSIS — D126 Benign neoplasm of colon, unspecified: Secondary | ICD-10-CM | POA: Diagnosis not present

## 2018-07-26 DIAGNOSIS — Z6823 Body mass index (BMI) 23.0-23.9, adult: Secondary | ICD-10-CM | POA: Diagnosis not present

## 2018-07-26 DIAGNOSIS — H3530 Unspecified macular degeneration: Secondary | ICD-10-CM | POA: Diagnosis not present

## 2018-07-26 DIAGNOSIS — E559 Vitamin D deficiency, unspecified: Secondary | ICD-10-CM | POA: Diagnosis not present

## 2018-07-26 DIAGNOSIS — G4761 Periodic limb movement disorder: Secondary | ICD-10-CM | POA: Diagnosis not present

## 2018-08-07 ENCOUNTER — Ambulatory Visit (INDEPENDENT_AMBULATORY_CARE_PROVIDER_SITE_OTHER): Payer: PPO | Admitting: Diagnostic Neuroimaging

## 2018-08-07 ENCOUNTER — Encounter: Payer: Self-pay | Admitting: Diagnostic Neuroimaging

## 2018-08-07 VITALS — BP 131/77 | HR 72 | Ht 67.0 in | Wt 142.0 lb

## 2018-08-07 DIAGNOSIS — I63412 Cerebral infarction due to embolism of left middle cerebral artery: Secondary | ICD-10-CM

## 2018-08-07 NOTE — Progress Notes (Signed)
[facility]  PATIENT: Bethany Ross DOB: May 23, 1951  REFERRING CLINICIAN: Forde Dandy, S  HISTORY FROM: patient  REASON FOR VISIT: follow up   HISTORICAL  CHIEF COMPLAINT:  Chief Complaint  Patient presents with  . Follow-up    Rm 7, alone  . Cerebrovascular Accident    Had stroke Slurred speech, went ot [place], had TPA, then transferred to [facility]. Tookk plavix and asa for 3 wks, no asa    HISTORY OF PRESENT ILLNESS:   UPDATE (69/20/2020, VRP): Stopped aspirin 325mg  about 1 year ago. She had sudden onset of aphasia on 06/23/2018 witness by husband and friend at a Christmas party. She was transferred from [place] and admitted at [facility]. TPA given. Speech returned shortly afterwards. No other deficits. MRI showed "few punctate 5 mm acute ischemic infarcts involving the posterior left frontal lobe and small chronic right PCA territory infarct." She took Plavix and aspirin 81mg  for 3 weeks, now only taking aspirin 81mg . Valsartan was changed to losartan 50mg  daily. Started Lipitor. She is tolerating medications well. Blood pressures have been normal. No changes from previous visit in vision. She reports feeling great today.   UPDATE (06/15/17, VRP): Since last visit, doing well. Tolerating meds. No alleviating or aggravating factors.  UPDATE 10/13/16: Since last visit, patient feels well. Ophthal visit on 09/27/16, shows some progression of vision field constriction since Sept 2017, and therefore eye doctor requested neurology follow up. Patient does not feel an vision changes. No new neuro symptoms.   UPDATE 06/14/16: Since last visit, doing well. No new issues. Vision stable. Now on aspirin 81mg  daily (due to excessive bruising).   UPDATE 12/05/15: Since last visit, doing well. No new events. Still with fatigue. Left side vision blurriness stable. Still with some fatigue.   PRIOR HPI (09/01/15): 68 year old right-handed female with hypertension, here for evaluation of  stroke. 08/11/15 patient noticed blurred vision from her left eye towards the left side. On 08/18/15 patient had numbness of left leg. Patient went to PCP for evaluation, had MRI of the brain ordered, which showed a right occipital polar ischemic infarction. Patient referred to me for further evaluation. In retrospect patient had another episode of lightheadedness and left leg numbness in December 2016, transient, which she did not seek medical attention for. Patient still has some mild vision difficulty. Her left-sided numbness and weakness has improved.   REVIEW OF SYSTEMS: Full 14 system review of systems performed and negative except: restless legs, loss of vision, ringing in ears, and cold intolerance.    ALLERGIES: No Known Allergies  HOME MEDICATIONS: Outpatient Medications Prior to Visit  Medication Sig Dispense Refill  . amLODipine (NORVASC) 2.5 MG tablet Take 2.5 mg by mouth at bedtime.   5  . aspirin EC 81 MG EC tablet Take 1 tablet (81 mg total) by mouth daily.    Marland Kitchen atorvastatin (LIPITOR) 40 MG tablet Take 1 tablet (40 mg total) by mouth daily at 6 PM. 30 tablet 3  . levothyroxine (SYNTHROID, LEVOTHROID) 137 MCG tablet Take 137 mcg by mouth daily before breakfast.    . losartan (COZAAR) 50 MG tablet Take 50 mg by mouth daily.  2  . clopidogrel (PLAVIX) 75 MG tablet Take 1 tablet (75 mg total) by mouth daily. (Patient not taking: Reported on 08/07/2018) 30 tablet 3   No facility-administered medications prior to visit.     PAST MEDICAL HISTORY: Past Medical History:  Diagnosis Date  . Cerebral infarction ([place])   . Hypertension  PAST SURGICAL HISTORY: Past Surgical History:  Procedure Laterality Date  . BREAST BIOPSY Left    benign  . BREAST EXCISIONAL BIOPSY Left    benign  . THYROIDECTOMY  1998  . TONSILLECTOMY     as child    FAMILY HISTORY: Family History  Problem Relation Age of Onset  . Cancer - Cervical Mother   . Cancer Father        esophageal     SOCIAL HISTORY:  Social History   Socioeconomic History  . Marital status: Married    Spouse name: [name]  . Number of children: 0  . Years of education: 52  . Highest education level: Not on file  Occupational History  . Occupation: N/a    Comment: retired  Scientific laboratory technician  . Financial resource strain: Not on file  . Food insecurity:    Worry: Not on file    Inability: Not on file  . Transportation needs:    Medical: Not on file    Non-medical: Not on file  Tobacco Use  . Smoking status: Former Smoker    Last attempt to quit: 08/31/1996    Years since quitting: 21.9  . Smokeless tobacco: Never Used  Substance and Sexual Activity  . Alcohol use: Yes    Alcohol/week: 0.0 standard drinks    Comment: 4 glasses wine/weekly  . Drug use: No  . Sexual activity: Not on file  Lifestyle  . Physical activity:    Days per week: Not on file    Minutes per session: Not on file  . Stress: Not on file  Relationships  . Social connections:    Talks on phone: Not on file    Gets together: Not on file    Attends religious service: Not on file    Active member of club or organization: Not on file    Attends meetings of clubs or organizations: Not on file    Relationship status: Not on file  . Intimate partner violence:    Fear of current or ex partner: Not on file    Emotionally abused: Not on file    Physically abused: Not on file    Forced sexual activity: Not on file  Other Topics Concern  . Not on file  Social History Narrative   Lives at home with husband   Caffeine use- very little     PHYSICAL EXAM  GENERAL EXAM/CONSTITUTIONAL: Vitals:  Vitals:   08/07/18 1346  BP: 131/77  Pulse: 72  Weight: 142 lb (64.4 kg)  Height: 5\' 7"  (1.702 m)   Body mass index is 22.24 kg/m. No exam data present  Patient is in no distress; well developed, nourished and groomed; neck is supple  CARDIOVASCULAR:  Examination of carotid arteries is normal; no carotid  bruits  Regular rate and rhythm, no murmurs  Examination of peripheral vascular system by observation and palpation is normal  EYES:  Ophthalmoscopic exam of optic discs and posterior segments is normal; no papilledema or hemorrhages  MUSCULOSKELETAL:  Gait, strength, tone, movements noted in Neurologic exam below  NEUROLOGIC: MENTAL STATUS:  No flowsheet data found.  awake, alert, oriented to person, place and time  recent and remote memory intact  normal attention and concentration  language fluent, comprehension intact, naming intact,   fund of knowledge appropriate  CRANIAL NERVE:   2nd - no papilledema on fundoscopic exam  2nd, 3rd, 4th, 6th - pupils equal and reactive to light, visual fields full to confrontation, extraocular muscles  intact, no nystagmus  5th - facial sensation symmetric  7th - facial strength symmetric  8th - hearing intact  9th - palate elevates symmetrically, uvula midline  11th - shoulder shrug symmetric  12th - tongue protrusion midline  MOTOR:   normal bulk and tone, full strength in the BUE, BLE  SENSORY:   normal and symmetric to light touch  COORDINATION:   finger-nose-finger, fine finger movements normal  REFLEXES:   deep tendon reflexes present and symmetric  GAIT/STATION:   narrow based gait    DIAGNOSTIC DATA (LABS, IMAGING, TESTING) - I reviewed patient records, labs, notes, testing and imaging myself where available.  No results found for: WBC, HGB, HCT, MCV, PLT No results found for: NA, K, CL, CO2, GLUCOSE, BUN, CREATININE, CALCIUM, PROT, ALBUMIN, AST, ALT, ALKPHOS, BILITOT, GFRNONAA, GFRAA Lab Results  Component Value Date   CHOL 187 06/24/2018   HDL 51 06/24/2018   LDLCALC 125 (H) 06/24/2018   TRIG 55 06/24/2018   CHOLHDL 3.7 06/24/2018   Lab Results  Component Value Date   HGBA1C 5.1 06/24/2018   No results found for: VITAMINB12 No results found for: TSH  08/19/15 MRI brain [I reviewed  images myself and agree with interpretation. -VRP]  - acute-subacute right occipital pole stroke - mild chronic small vessel ischemic disease   08/21/15 Carotid u/s  - no stenosis  08/21/15 TTE - Left ventricle: The cavity size was normal. There was mild focal basal hypertrophy of the septum. Systolic function was normal. The estimated ejection fraction was in the range of 60% to 65%. Doppler parameters are consistent with abnormal left ventricular relaxation (grade 1 diastolic dysfunction). - Mitral valve: There was mild regurgitation. - Pericardium, extracardiac: A trivial pericardial effusion was identified posterior to the heart.  09/16/15 MRA head  - Moderate stenosis in distal cavernous right internal carotid artery - 2 mm downward pointing aneurysm in the distal left cavernous and proximal left supraclinoid internal carotid artery - High-grade stenosis of the proximal nondominant right A1 segment - Moderate narrowing of mid and distal small vessels in both the anterior and posterior circulation compatible with atherosclerotic disease - High-grade stenosis of the distal right P2 segment  11/17/15 Sleep study  - Normal AHI; no significant sleep apnea - Mild to moderate snoring - Sleep fragmentation - Mild periodic limb movements  12/05/15 cardiac monitor (37 days) - sinus rhythm   07/21/16 MRA head  - severe focal stenosis of the right posterior cerebral artery in its midportion and moderate stenosis of cavernous segment of right internal carotid artery. Stable 2 mm dilatation of the distal left cavernous internal carotid artery may represent infundibulum versus aneurysm. No significant change compared with MRA dated 09/16/15.  11/02/16 MRI brain  Mildly abnormal MRI brain (without) demonstrating: 1. Chronic right occipital pole ischemic infarction. 2. Mild chronic small vessel ischemic disease. 3. Left frontal extra-axial, irregular ossification projecting from the inner  table of the skull. May represent focal osteoma vs calcified meningioma. 4. No acute findings.   06/24/2018 MRI Brain WO Contrast[I reviewed images myself and agree with interpretation. -VRP]  1. Few punctate 5 mm acute ischemic infarcts involving the posterior left frontal lobe as above. No associated hemorrhage. 2. Small chronic right PCA territory infarct. 3. Underlying mild chronic microvascular ischemic disease.   06/24/2018 Transthoracic Echocardiogram  - Left ventricle: The cavity size was normal. Wall thickness was increased in a pattern of moderate LVH. Systolic function was normal. The estimated ejection fraction was in the  range of 60% to 65%. Wall motion was normal; there were no regional wall motion abnormalities. Features are consistent with a pseudonormal left ventricular filling pattern, with concomitant abnormal relaxation and increased filling pressure (grade 2 diastolic dysfunction). Doppler parameters are consistent with indeterminate ventricular filling pressure. - Mitral valve: There was mild to moderate regurgitation. - Atrial septum: No defect or patent foramen ovale was identified. - Tricuspid valve: There was mild regurgitation.  06/25/2018 Carotid Dopplers - Preliminary report:No significant ICA stenosis. Vertebral artery flow is antegrade.   06/25/18 CT HEAD IMPRESSION: [I reviewed images myself and agree with interpretation. -VRP] 1. No acute intracranial infarct or other abnormality identified. No intracranial hemorrhage. 2. ASPECTS = 10. 3. Small chronic right PCA territory infarct.  06/25/18 CTA HEAD AND NECK IMPRESSION: [I reviewed images myself and agree with interpretation. -VRP]  1. Diffuse narrowing of the left M1 segment with superimposed severe tandem segmental stenoses/narrowing as above. While these findings could reflect new/progressive atherosclerotic change, possible recanalized thrombus could also be considered. No  frank emergent large vessel occlusion. Left MCA branches perfused distally. 2. Otherwise stable intracranial MRA with moderate cavernous right ICA, severe right A1, and severe right P2 stenoses. 3. Wide patency of the major arterial vasculature of the neck without hemodynamically significant stenosis. 4. 2 mm cavernous left ICA aneurysm, stable.     ASSESSMENT AND PLAN  68 y.o. year old female here with right occipital ischemic infarction, which appears to be embolic based on imaging characteristics. MRA head shows multiple areas of intracranial atherosclerosis, including right P2 stenosis (which is likely symptomatic and the cause of stroke), and related to underlying uncontrolled hypertension. Also with tiny unruptured cerebral aneurysm (asymptomatic) which is stable from 2017 - 2018.   Now with increased vision constriction on vision field testing per ophthalmology in March 2018, but no subjective changes per patient. Now improved on last visit to eye doctor.    Dx:  1. Cerebrovascular accident (CVA) due to embolism of left middle cerebral artery (HCC)      PLAN:  STROKE (embolic; artery-artery embolism) - continue aspirin 81mg  daily - continue losartan 50mg  daily + amlodipine 2.5mg  at bedtime - continue lipitor 40mg  daily - follow up with Dr. Forde Dandy diabetes and lipid screening/treatment - repeat MRA head to follow up aneurysm and intracranial stenoses; if left MCA stenosis is persistent, then continue med mgmt; if MCA stenosis from CTA is not seen, then may consider TEE / loop recorder to eval for cardioembolic source; may consider vasculitis / hypercoag workup - stroke warning signs and education reviewed  Orders Placed This Encounter  Procedures  . MR MRA HEAD WO CONTRAST   Return in about 4 months (around 12/06/2018).   Penni Bombard, MD 7/51/0258, 5:27 PM Certified in Neurology, Neurophysiology and [facility] [address],  [place], [place] [address] [phone]

## 2018-08-07 NOTE — Patient Instructions (Signed)
STROKE - continue aspirin 81mg  daily - continue losartan 50mg  daily + amlodipine 2.5mg  at bedtime - continue lipitor 40mg  daily - follow up with Dr. Forde Dandy diabetes and lipid screening/treatment - repeat MRA head

## 2018-08-08 ENCOUNTER — Telehealth: Payer: Self-pay | Admitting: Diagnostic Neuroimaging

## 2018-08-08 NOTE — Telephone Encounter (Signed)
Spoke to the patient she is aware.  °

## 2018-08-08 NOTE — Telephone Encounter (Signed)
Health team order sent to GI. They will reach out to the pt to schedule.

## 2018-08-29 ENCOUNTER — Ambulatory Visit
Admission: RE | Admit: 2018-08-29 | Discharge: 2018-08-29 | Disposition: A | Discharge status: Home / Self Care | Payer: PPO | Source: Ambulatory Visit | Attending: Diagnostic Neuroimaging | Admitting: Diagnostic Neuroimaging

## 2018-08-29 DIAGNOSIS — I63412 Cerebral infarction due to embolism of left middle cerebral artery: Secondary | ICD-10-CM

## 2018-08-30 DIAGNOSIS — H353131 Nonexudative age-related macular degeneration, bilateral, early dry stage: Secondary | ICD-10-CM | POA: Diagnosis not present

## 2018-11-02 DIAGNOSIS — E890 Postprocedural hypothyroidism: Secondary | ICD-10-CM | POA: Diagnosis not present

## 2018-12-13 ENCOUNTER — Ambulatory Visit: Payer: PPO | Admitting: Diagnostic Neuroimaging

## 2019-01-16 DIAGNOSIS — I10 Essential (primary) hypertension: Secondary | ICD-10-CM | POA: Diagnosis not present

## 2019-01-16 DIAGNOSIS — E7849 Other hyperlipidemia: Secondary | ICD-10-CM | POA: Diagnosis not present

## 2019-01-16 DIAGNOSIS — E559 Vitamin D deficiency, unspecified: Secondary | ICD-10-CM | POA: Diagnosis not present

## 2019-01-23 DIAGNOSIS — I639 Cerebral infarction, unspecified: Secondary | ICD-10-CM | POA: Diagnosis not present

## 2019-01-23 DIAGNOSIS — I10 Essential (primary) hypertension: Secondary | ICD-10-CM | POA: Diagnosis not present

## 2019-01-23 DIAGNOSIS — I5189 Other ill-defined heart diseases: Secondary | ICD-10-CM | POA: Diagnosis not present

## 2019-01-23 DIAGNOSIS — G4761 Periodic limb movement disorder: Secondary | ICD-10-CM | POA: Diagnosis not present

## 2019-01-23 DIAGNOSIS — H3530 Unspecified macular degeneration: Secondary | ICD-10-CM | POA: Diagnosis not present

## 2019-01-23 DIAGNOSIS — E559 Vitamin D deficiency, unspecified: Secondary | ICD-10-CM | POA: Diagnosis not present

## 2019-01-23 DIAGNOSIS — Z0000 Encounter for general adult medical examination without abnormal findings: Secondary | ICD-10-CM | POA: Diagnosis not present

## 2019-01-23 DIAGNOSIS — E890 Postprocedural hypothyroidism: Secondary | ICD-10-CM | POA: Diagnosis not present

## 2019-01-23 DIAGNOSIS — D126 Benign neoplasm of colon, unspecified: Secondary | ICD-10-CM | POA: Diagnosis not present

## 2019-01-23 DIAGNOSIS — R42 Dizziness and giddiness: Secondary | ICD-10-CM | POA: Diagnosis not present

## 2019-01-23 DIAGNOSIS — M810 Age-related osteoporosis without current pathological fracture: Secondary | ICD-10-CM | POA: Diagnosis not present

## 2019-01-23 DIAGNOSIS — E785 Hyperlipidemia, unspecified: Secondary | ICD-10-CM | POA: Diagnosis not present

## 2019-02-05 DIAGNOSIS — R0602 Shortness of breath: Secondary | ICD-10-CM | POA: Diagnosis not present

## 2019-02-05 DIAGNOSIS — U071 COVID-19: Secondary | ICD-10-CM | POA: Diagnosis not present

## 2019-04-12 ENCOUNTER — Telehealth: Payer: Self-pay

## 2019-04-12 ENCOUNTER — Ambulatory Visit: Payer: PPO | Admitting: Adult Health

## 2019-04-12 NOTE — Telephone Encounter (Signed)
Patient was a no call/no show for their appointment today.   

## 2019-07-26 DIAGNOSIS — M810 Age-related osteoporosis without current pathological fracture: Secondary | ICD-10-CM | POA: Diagnosis not present

## 2019-07-26 DIAGNOSIS — I639 Cerebral infarction, unspecified: Secondary | ICD-10-CM | POA: Diagnosis not present

## 2019-07-26 DIAGNOSIS — I10 Essential (primary) hypertension: Secondary | ICD-10-CM | POA: Diagnosis not present

## 2019-07-26 DIAGNOSIS — E785 Hyperlipidemia, unspecified: Secondary | ICD-10-CM | POA: Diagnosis not present

## 2019-07-26 DIAGNOSIS — D126 Benign neoplasm of colon, unspecified: Secondary | ICD-10-CM | POA: Diagnosis not present

## 2019-07-26 DIAGNOSIS — E890 Postprocedural hypothyroidism: Secondary | ICD-10-CM | POA: Diagnosis not present

## 2019-07-26 DIAGNOSIS — H3530 Unspecified macular degeneration: Secondary | ICD-10-CM | POA: Diagnosis not present

## 2019-08-24 DIAGNOSIS — H903 Sensorineural hearing loss, bilateral: Secondary | ICD-10-CM | POA: Diagnosis not present

## 2019-09-12 DIAGNOSIS — H25813 Combined forms of age-related cataract, bilateral: Secondary | ICD-10-CM | POA: Diagnosis not present

## 2019-09-12 DIAGNOSIS — H524 Presbyopia: Secondary | ICD-10-CM | POA: Diagnosis not present

## 2019-09-12 DIAGNOSIS — H353131 Nonexudative age-related macular degeneration, bilateral, early dry stage: Secondary | ICD-10-CM | POA: Diagnosis not present

## 2019-09-12 DIAGNOSIS — I6349 Cerebral infarction due to embolism of other cerebral artery: Secondary | ICD-10-CM | POA: Diagnosis not present

## 2019-10-05 DIAGNOSIS — H9313 Tinnitus, bilateral: Secondary | ICD-10-CM | POA: Diagnosis not present

## 2019-10-05 DIAGNOSIS — H903 Sensorineural hearing loss, bilateral: Secondary | ICD-10-CM | POA: Diagnosis not present

## 2019-10-22 DIAGNOSIS — K2900 Acute gastritis without bleeding: Secondary | ICD-10-CM | POA: Diagnosis not present

## 2019-10-22 DIAGNOSIS — Z20828 Contact with and (suspected) exposure to other viral communicable diseases: Secondary | ICD-10-CM | POA: Diagnosis not present

## 2020-01-24 DIAGNOSIS — E890 Postprocedural hypothyroidism: Secondary | ICD-10-CM | POA: Diagnosis not present

## 2020-01-24 DIAGNOSIS — E7849 Other hyperlipidemia: Secondary | ICD-10-CM | POA: Diagnosis not present

## 2020-01-24 DIAGNOSIS — M810 Age-related osteoporosis without current pathological fracture: Secondary | ICD-10-CM | POA: Diagnosis not present

## 2020-01-28 DIAGNOSIS — Z1331 Encounter for screening for depression: Secondary | ICD-10-CM | POA: Diagnosis not present

## 2020-01-28 DIAGNOSIS — I5189 Other ill-defined heart diseases: Secondary | ICD-10-CM | POA: Diagnosis not present

## 2020-01-28 DIAGNOSIS — E890 Postprocedural hypothyroidism: Secondary | ICD-10-CM | POA: Diagnosis not present

## 2020-01-28 DIAGNOSIS — R82998 Other abnormal findings in urine: Secondary | ICD-10-CM | POA: Diagnosis not present

## 2020-01-28 DIAGNOSIS — M810 Age-related osteoporosis without current pathological fracture: Secondary | ICD-10-CM | POA: Diagnosis not present

## 2020-01-28 DIAGNOSIS — D126 Benign neoplasm of colon, unspecified: Secondary | ICD-10-CM | POA: Diagnosis not present

## 2020-01-28 DIAGNOSIS — I639 Cerebral infarction, unspecified: Secondary | ICD-10-CM | POA: Diagnosis not present

## 2020-01-28 DIAGNOSIS — Z0000 Encounter for general adult medical examination without abnormal findings: Secondary | ICD-10-CM | POA: Diagnosis not present

## 2020-01-28 DIAGNOSIS — E559 Vitamin D deficiency, unspecified: Secondary | ICD-10-CM | POA: Diagnosis not present

## 2020-01-28 DIAGNOSIS — E7849 Other hyperlipidemia: Secondary | ICD-10-CM | POA: Diagnosis not present

## 2020-01-28 DIAGNOSIS — I10 Essential (primary) hypertension: Secondary | ICD-10-CM | POA: Diagnosis not present

## 2020-01-28 DIAGNOSIS — H3530 Unspecified macular degeneration: Secondary | ICD-10-CM | POA: Diagnosis not present

## 2020-05-27 DIAGNOSIS — Z01419 Encounter for gynecological examination (general) (routine) without abnormal findings: Secondary | ICD-10-CM | POA: Diagnosis not present

## 2020-05-27 DIAGNOSIS — Z1231 Encounter for screening mammogram for malignant neoplasm of breast: Secondary | ICD-10-CM | POA: Diagnosis not present

## 2020-09-08 IMAGING — MR MR HEAD W/O CM
10 of 11 series · 42 of 48 positions shown · non-contrast
Comparison: Previous MRI from 11/02/2016

CLINICAL DATA: Initial evaluation for acute onset aphasia. Evaluate
for stroke. Status post tPA.

EXAM:
MRI HEAD WITHOUT CONTRAST
TECHNIQUE: Multiplanar, multiecho pulse sequences of the brain and surrounding
structures were obtained without intravenous contrast.

[Series 5: DWI · axial · 3.0mm · 0.88mm/px · z∈[-41,+92]mm · 9 of 92 slices shown (1 of 4)]
[im 1/92]
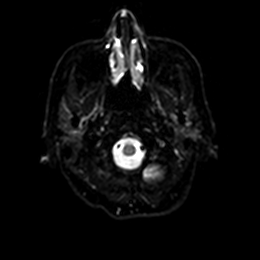
[im 12/92]
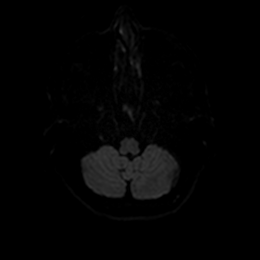
[im 23/92]
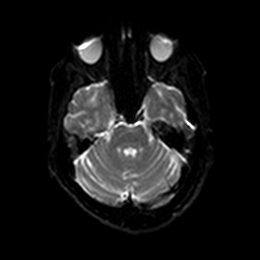
[im 35/92]
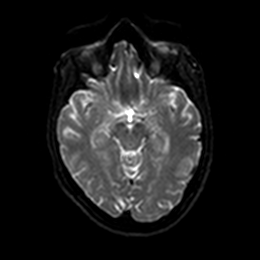
[im 46/92]
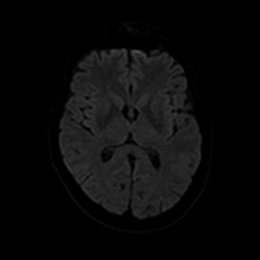
[im 57/92]
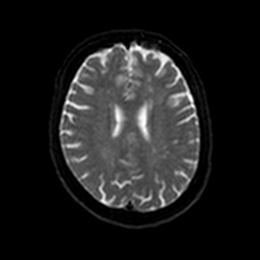
[im 69/92]
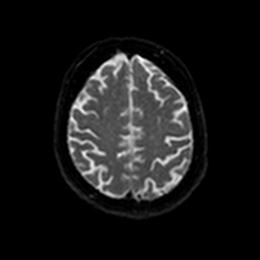
[im 80/92]
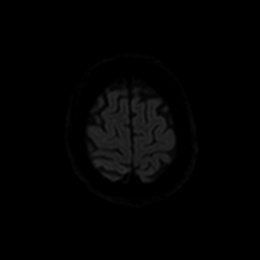
[im 92/92]
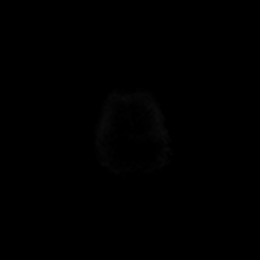

[Series 6: DWI · axial · 3.0mm · 0.88mm/px · z∈[-41,+92]mm · 4 of 46 slices shown (2 of 4)]
[im 1/46]
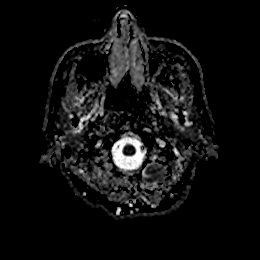
[im 16/46]
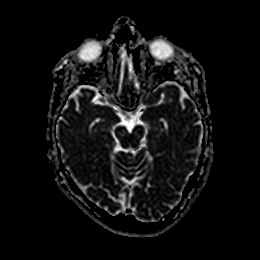
[im 31/46]
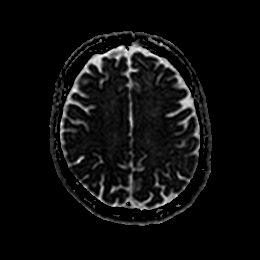
[im 46/46]
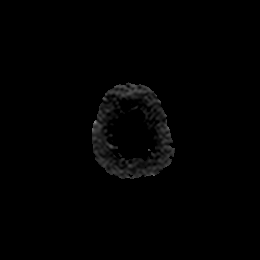

[Series 7: DWI · coronal · 4.0mm · 0.88mm/px · 6 of 68 slices shown (3 of 4)]
[im 1/68]
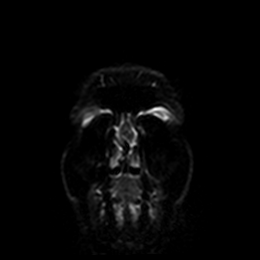
[im 14/68]
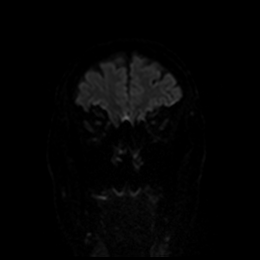
[im 27/68]
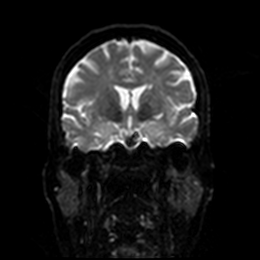
[im 41/68]
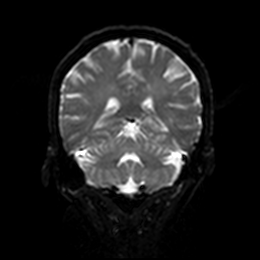
[im 54/68]
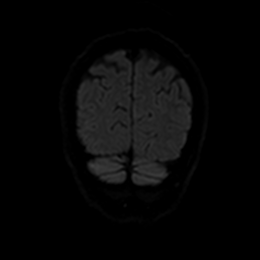
[im 68/68]
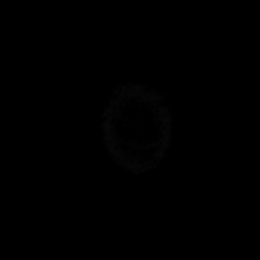

[Series 8: DWI · coronal · 4.0mm · 0.88mm/px · 3 of 34 slices shown (4 of 4)]
[im 1/34]
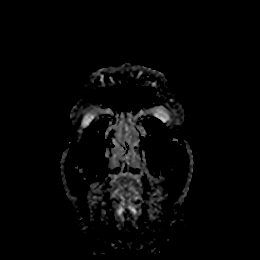
[im 17/34]
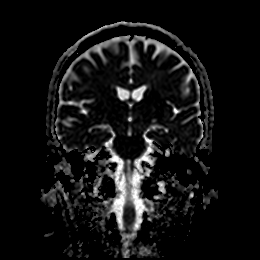
[im 34/34]
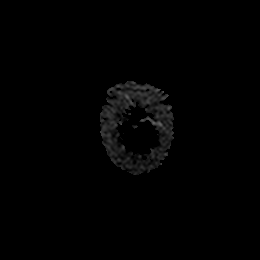

[Series 9: T1 · sagittal · 5.0mm · 0.75mm/px · 2 of 23 slices shown]
[im 1/23]
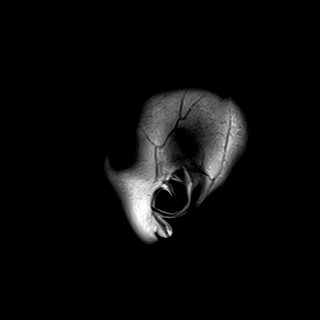
[im 23/23]
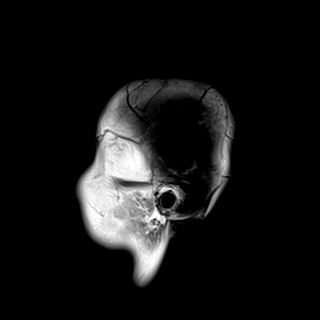

[Series 10: T2 · axial · 5.0mm · 0.72mm/px · z∈[-46,+103]mm · 2 of 26 slices shown (1 of 2)]
[im 1/26]
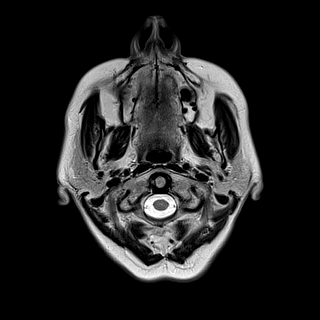
[im 26/26]
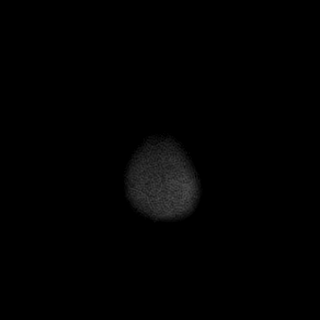

[Series 11: FLAIR · axial · 5.0mm · 0.45mm/px · z∈[-47,+102]mm · 2 of 26 slices shown]
[im 1/26]
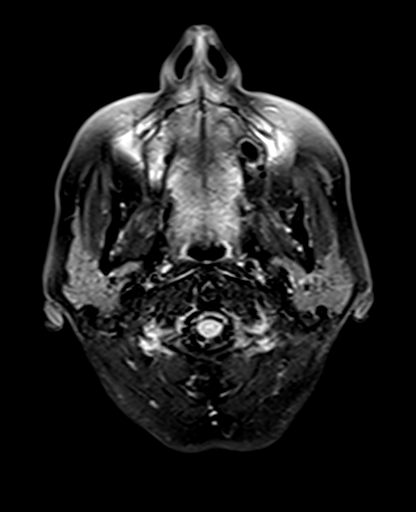
[im 26/26]
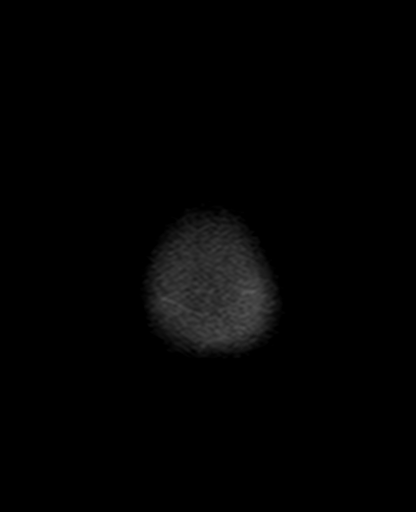

[Series 12: swi_images · axial · 3.0mm · 0.90mm/px · z∈[-64,+112]mm · 6 of 60 slices shown]
[im 1/60]
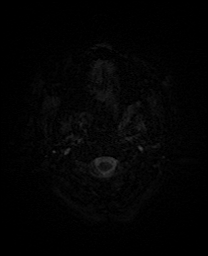
[im 12/60]
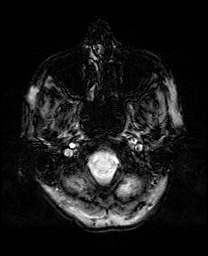
[im 24/60]
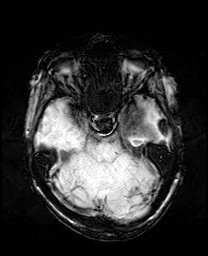
[im 36/60]
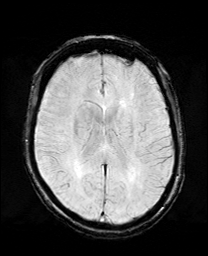
[im 48/60]
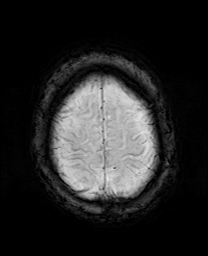
[im 60/60]
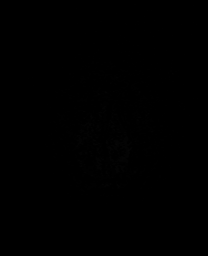

[Series 13: mip_images(sw) · axial · 24.0mm · 0.90mm/px · z∈[-54,+102]mm · 5 of 53 slices shown]
[im 1/53]
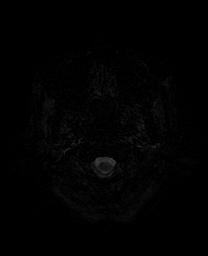
[im 14/53]
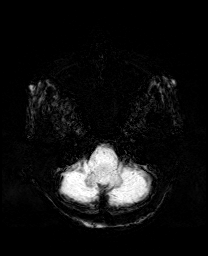
[im 27/53]
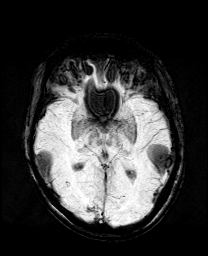
[im 40/53]
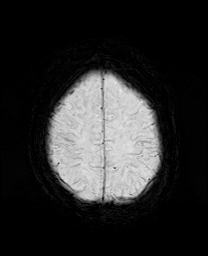
[im 53/53]
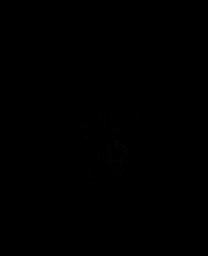

[Series 15: T2 · coronal · 5.0mm · 0.34mm/px · 3 of 29 slices shown (2 of 2)]
[im 1/29]
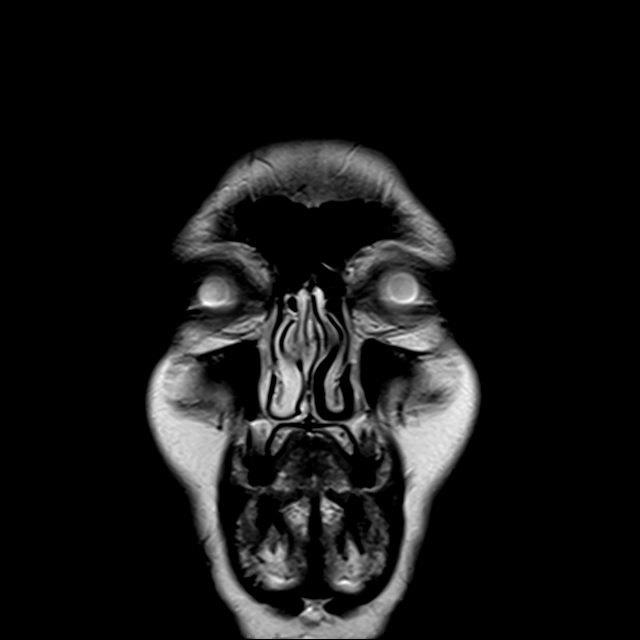
[im 15/29]
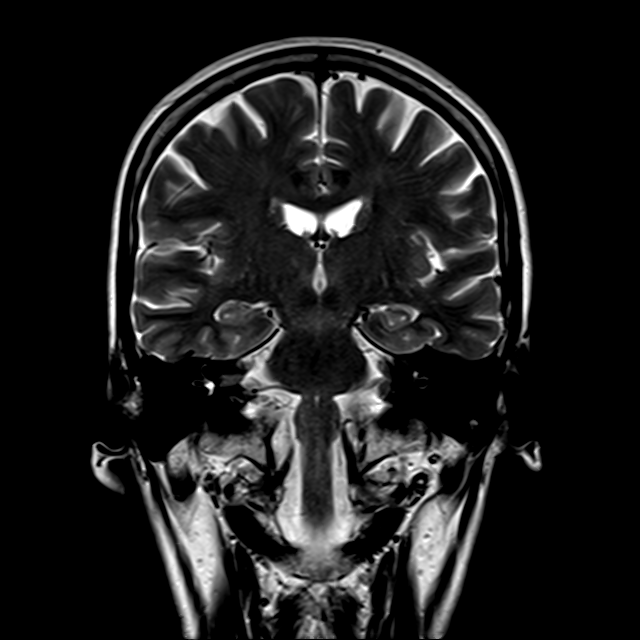
[im 29/29]
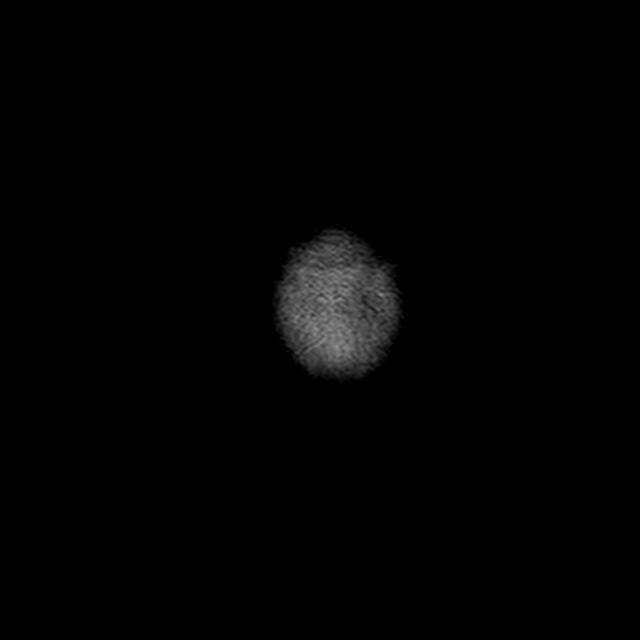

[42 of 48 positions shown; findings below may reference images not displayed]

FINDINGS: Brain: Cerebral volume within normal limits for age. Patchy and
confluent T2/FLAIR hyperintensity within the periventricular and
deep white matter both cerebral hemispheres most consistent with
chronic microvascular ischemic disease, mild in nature. Small remote
cortical infarct at the right occipital pole, compatible with remote
right PCA territory infarct. Associated small amount of chronic
hemosiderin staining. No other areas of chronic cortical infarction.

Few punctate foci of restricted diffusion seen involving the
posterior left frontal region, compatible with tiny acute ischemic
infarcts. These are cortical and subcortical in nature, and measure
up to 5 mm in size (series 5, images 73, 74). No associated
hemorrhage. No other evidence for acute or subacute ischemia. No
findings to suggest acute intracranial hemorrhage.

No mass lesion, midline shift or mass effect. No hydrocephalus. No
extra-axial fluid collection. Pituitary gland normal. Midline
structures intact.

Vascular: Major intracranial vascular flow voids are maintained at
the skull base.

Skull and upper cervical spine: Craniocervical junction normal.
Upper cervical spine within normal limits. Bone marrow signal
intensity normal. 2.1 cm densely calcified lesion overlying the left
frontal convexity favored to reflect a focal exostosis. No scalp
soft tissue abnormality.

Sinuses/Orbits: Globes and orbital soft tissues within normal
limits. Paranasal sinuses are clear. Trace opacity left mastoid air
cells, of doubtful significance.

Other: None.
IMPRESSION: 1. Few punctate 5 mm acute ischemic infarcts involving the posterior
left frontal lobe as above. No associated hemorrhage.
2. Small chronic right PCA territory infarct.
3. Underlying mild chronic microvascular ischemic disease.

## 2020-09-17 DIAGNOSIS — H524 Presbyopia: Secondary | ICD-10-CM | POA: Diagnosis not present

## 2020-11-20 DIAGNOSIS — M9903 Segmental and somatic dysfunction of lumbar region: Secondary | ICD-10-CM | POA: Diagnosis not present

## 2020-11-20 DIAGNOSIS — M50323 Other cervical disc degeneration at C6-C7 level: Secondary | ICD-10-CM | POA: Diagnosis not present

## 2020-11-20 DIAGNOSIS — M9901 Segmental and somatic dysfunction of cervical region: Secondary | ICD-10-CM | POA: Diagnosis not present

## 2020-11-20 DIAGNOSIS — M5136 Other intervertebral disc degeneration, lumbar region: Secondary | ICD-10-CM | POA: Diagnosis not present

## 2020-11-20 DIAGNOSIS — M5442 Lumbago with sciatica, left side: Secondary | ICD-10-CM | POA: Diagnosis not present

## 2020-11-20 DIAGNOSIS — M9904 Segmental and somatic dysfunction of sacral region: Secondary | ICD-10-CM | POA: Diagnosis not present

## 2020-11-20 DIAGNOSIS — M5413 Radiculopathy, cervicothoracic region: Secondary | ICD-10-CM | POA: Diagnosis not present

## 2020-11-20 DIAGNOSIS — M5137 Other intervertebral disc degeneration, lumbosacral region: Secondary | ICD-10-CM | POA: Diagnosis not present

## 2020-11-24 DIAGNOSIS — M5413 Radiculopathy, cervicothoracic region: Secondary | ICD-10-CM | POA: Diagnosis not present

## 2020-11-24 DIAGNOSIS — M9904 Segmental and somatic dysfunction of sacral region: Secondary | ICD-10-CM | POA: Diagnosis not present

## 2020-11-24 DIAGNOSIS — M50323 Other cervical disc degeneration at C6-C7 level: Secondary | ICD-10-CM | POA: Diagnosis not present

## 2020-11-24 DIAGNOSIS — M5442 Lumbago with sciatica, left side: Secondary | ICD-10-CM | POA: Diagnosis not present

## 2020-11-24 DIAGNOSIS — M9903 Segmental and somatic dysfunction of lumbar region: Secondary | ICD-10-CM | POA: Diagnosis not present

## 2020-11-24 DIAGNOSIS — M9901 Segmental and somatic dysfunction of cervical region: Secondary | ICD-10-CM | POA: Diagnosis not present

## 2020-11-24 DIAGNOSIS — M5137 Other intervertebral disc degeneration, lumbosacral region: Secondary | ICD-10-CM | POA: Diagnosis not present

## 2020-11-24 DIAGNOSIS — M5136 Other intervertebral disc degeneration, lumbar region: Secondary | ICD-10-CM | POA: Diagnosis not present

## 2020-11-26 DIAGNOSIS — M9904 Segmental and somatic dysfunction of sacral region: Secondary | ICD-10-CM | POA: Diagnosis not present

## 2020-11-26 DIAGNOSIS — M9903 Segmental and somatic dysfunction of lumbar region: Secondary | ICD-10-CM | POA: Diagnosis not present

## 2020-11-26 DIAGNOSIS — M5442 Lumbago with sciatica, left side: Secondary | ICD-10-CM | POA: Diagnosis not present

## 2020-11-26 DIAGNOSIS — M5136 Other intervertebral disc degeneration, lumbar region: Secondary | ICD-10-CM | POA: Diagnosis not present

## 2020-11-26 DIAGNOSIS — M9901 Segmental and somatic dysfunction of cervical region: Secondary | ICD-10-CM | POA: Diagnosis not present

## 2020-11-26 DIAGNOSIS — M50323 Other cervical disc degeneration at C6-C7 level: Secondary | ICD-10-CM | POA: Diagnosis not present

## 2020-11-26 DIAGNOSIS — M5413 Radiculopathy, cervicothoracic region: Secondary | ICD-10-CM | POA: Diagnosis not present

## 2020-11-26 DIAGNOSIS — M5137 Other intervertebral disc degeneration, lumbosacral region: Secondary | ICD-10-CM | POA: Diagnosis not present

## 2020-11-28 DIAGNOSIS — M5413 Radiculopathy, cervicothoracic region: Secondary | ICD-10-CM | POA: Diagnosis not present

## 2020-11-28 DIAGNOSIS — M9903 Segmental and somatic dysfunction of lumbar region: Secondary | ICD-10-CM | POA: Diagnosis not present

## 2020-11-28 DIAGNOSIS — M9901 Segmental and somatic dysfunction of cervical region: Secondary | ICD-10-CM | POA: Diagnosis not present

## 2020-11-28 DIAGNOSIS — M5136 Other intervertebral disc degeneration, lumbar region: Secondary | ICD-10-CM | POA: Diagnosis not present

## 2020-11-28 DIAGNOSIS — M5442 Lumbago with sciatica, left side: Secondary | ICD-10-CM | POA: Diagnosis not present

## 2020-11-28 DIAGNOSIS — M9904 Segmental and somatic dysfunction of sacral region: Secondary | ICD-10-CM | POA: Diagnosis not present

## 2020-11-28 DIAGNOSIS — M5137 Other intervertebral disc degeneration, lumbosacral region: Secondary | ICD-10-CM | POA: Diagnosis not present

## 2020-11-28 DIAGNOSIS — M50323 Other cervical disc degeneration at C6-C7 level: Secondary | ICD-10-CM | POA: Diagnosis not present

## 2020-12-01 DIAGNOSIS — M5413 Radiculopathy, cervicothoracic region: Secondary | ICD-10-CM | POA: Diagnosis not present

## 2020-12-01 DIAGNOSIS — M5442 Lumbago with sciatica, left side: Secondary | ICD-10-CM | POA: Diagnosis not present

## 2020-12-01 DIAGNOSIS — M9901 Segmental and somatic dysfunction of cervical region: Secondary | ICD-10-CM | POA: Diagnosis not present

## 2020-12-01 DIAGNOSIS — M9903 Segmental and somatic dysfunction of lumbar region: Secondary | ICD-10-CM | POA: Diagnosis not present

## 2020-12-01 DIAGNOSIS — M50323 Other cervical disc degeneration at C6-C7 level: Secondary | ICD-10-CM | POA: Diagnosis not present

## 2020-12-01 DIAGNOSIS — M9904 Segmental and somatic dysfunction of sacral region: Secondary | ICD-10-CM | POA: Diagnosis not present

## 2020-12-01 DIAGNOSIS — M5136 Other intervertebral disc degeneration, lumbar region: Secondary | ICD-10-CM | POA: Diagnosis not present

## 2020-12-01 DIAGNOSIS — M5137 Other intervertebral disc degeneration, lumbosacral region: Secondary | ICD-10-CM | POA: Diagnosis not present

## 2020-12-03 DIAGNOSIS — M5442 Lumbago with sciatica, left side: Secondary | ICD-10-CM | POA: Diagnosis not present

## 2020-12-03 DIAGNOSIS — M9903 Segmental and somatic dysfunction of lumbar region: Secondary | ICD-10-CM | POA: Diagnosis not present

## 2020-12-03 DIAGNOSIS — M9901 Segmental and somatic dysfunction of cervical region: Secondary | ICD-10-CM | POA: Diagnosis not present

## 2020-12-03 DIAGNOSIS — M5137 Other intervertebral disc degeneration, lumbosacral region: Secondary | ICD-10-CM | POA: Diagnosis not present

## 2020-12-03 DIAGNOSIS — M5136 Other intervertebral disc degeneration, lumbar region: Secondary | ICD-10-CM | POA: Diagnosis not present

## 2020-12-03 DIAGNOSIS — M50323 Other cervical disc degeneration at C6-C7 level: Secondary | ICD-10-CM | POA: Diagnosis not present

## 2020-12-03 DIAGNOSIS — M9904 Segmental and somatic dysfunction of sacral region: Secondary | ICD-10-CM | POA: Diagnosis not present

## 2020-12-03 DIAGNOSIS — M5413 Radiculopathy, cervicothoracic region: Secondary | ICD-10-CM | POA: Diagnosis not present

## 2020-12-08 DIAGNOSIS — M5137 Other intervertebral disc degeneration, lumbosacral region: Secondary | ICD-10-CM | POA: Diagnosis not present

## 2020-12-08 DIAGNOSIS — M9901 Segmental and somatic dysfunction of cervical region: Secondary | ICD-10-CM | POA: Diagnosis not present

## 2020-12-08 DIAGNOSIS — M9903 Segmental and somatic dysfunction of lumbar region: Secondary | ICD-10-CM | POA: Diagnosis not present

## 2020-12-08 DIAGNOSIS — M9904 Segmental and somatic dysfunction of sacral region: Secondary | ICD-10-CM | POA: Diagnosis not present

## 2020-12-08 DIAGNOSIS — M50323 Other cervical disc degeneration at C6-C7 level: Secondary | ICD-10-CM | POA: Diagnosis not present

## 2020-12-08 DIAGNOSIS — M5413 Radiculopathy, cervicothoracic region: Secondary | ICD-10-CM | POA: Diagnosis not present

## 2020-12-08 DIAGNOSIS — M5136 Other intervertebral disc degeneration, lumbar region: Secondary | ICD-10-CM | POA: Diagnosis not present

## 2020-12-08 DIAGNOSIS — M5442 Lumbago with sciatica, left side: Secondary | ICD-10-CM | POA: Diagnosis not present

## 2020-12-10 DIAGNOSIS — M5442 Lumbago with sciatica, left side: Secondary | ICD-10-CM | POA: Diagnosis not present

## 2020-12-10 DIAGNOSIS — M5137 Other intervertebral disc degeneration, lumbosacral region: Secondary | ICD-10-CM | POA: Diagnosis not present

## 2020-12-10 DIAGNOSIS — M5413 Radiculopathy, cervicothoracic region: Secondary | ICD-10-CM | POA: Diagnosis not present

## 2020-12-10 DIAGNOSIS — M5136 Other intervertebral disc degeneration, lumbar region: Secondary | ICD-10-CM | POA: Diagnosis not present

## 2020-12-10 DIAGNOSIS — M50323 Other cervical disc degeneration at C6-C7 level: Secondary | ICD-10-CM | POA: Diagnosis not present

## 2020-12-10 DIAGNOSIS — M9904 Segmental and somatic dysfunction of sacral region: Secondary | ICD-10-CM | POA: Diagnosis not present

## 2020-12-10 DIAGNOSIS — M9901 Segmental and somatic dysfunction of cervical region: Secondary | ICD-10-CM | POA: Diagnosis not present

## 2020-12-10 DIAGNOSIS — M9903 Segmental and somatic dysfunction of lumbar region: Secondary | ICD-10-CM | POA: Diagnosis not present

## 2020-12-12 DIAGNOSIS — M5136 Other intervertebral disc degeneration, lumbar region: Secondary | ICD-10-CM | POA: Diagnosis not present

## 2020-12-12 DIAGNOSIS — M9903 Segmental and somatic dysfunction of lumbar region: Secondary | ICD-10-CM | POA: Diagnosis not present

## 2020-12-12 DIAGNOSIS — M5442 Lumbago with sciatica, left side: Secondary | ICD-10-CM | POA: Diagnosis not present

## 2020-12-12 DIAGNOSIS — M5137 Other intervertebral disc degeneration, lumbosacral region: Secondary | ICD-10-CM | POA: Diagnosis not present

## 2020-12-12 DIAGNOSIS — M9901 Segmental and somatic dysfunction of cervical region: Secondary | ICD-10-CM | POA: Diagnosis not present

## 2020-12-12 DIAGNOSIS — M5413 Radiculopathy, cervicothoracic region: Secondary | ICD-10-CM | POA: Diagnosis not present

## 2020-12-12 DIAGNOSIS — M9904 Segmental and somatic dysfunction of sacral region: Secondary | ICD-10-CM | POA: Diagnosis not present

## 2020-12-12 DIAGNOSIS — M50323 Other cervical disc degeneration at C6-C7 level: Secondary | ICD-10-CM | POA: Diagnosis not present

## 2020-12-17 DIAGNOSIS — I499 Cardiac arrhythmia, unspecified: Secondary | ICD-10-CM | POA: Diagnosis not present

## 2020-12-17 DIAGNOSIS — M9904 Segmental and somatic dysfunction of sacral region: Secondary | ICD-10-CM | POA: Diagnosis not present

## 2020-12-17 DIAGNOSIS — M5442 Lumbago with sciatica, left side: Secondary | ICD-10-CM | POA: Diagnosis not present

## 2020-12-17 DIAGNOSIS — M5413 Radiculopathy, cervicothoracic region: Secondary | ICD-10-CM | POA: Diagnosis not present

## 2020-12-17 DIAGNOSIS — I10 Essential (primary) hypertension: Secondary | ICD-10-CM | POA: Diagnosis not present

## 2020-12-17 DIAGNOSIS — M9903 Segmental and somatic dysfunction of lumbar region: Secondary | ICD-10-CM | POA: Diagnosis not present

## 2020-12-17 DIAGNOSIS — M5136 Other intervertebral disc degeneration, lumbar region: Secondary | ICD-10-CM | POA: Diagnosis not present

## 2020-12-17 DIAGNOSIS — M50323 Other cervical disc degeneration at C6-C7 level: Secondary | ICD-10-CM | POA: Diagnosis not present

## 2020-12-17 DIAGNOSIS — M9901 Segmental and somatic dysfunction of cervical region: Secondary | ICD-10-CM | POA: Diagnosis not present

## 2020-12-17 DIAGNOSIS — M5137 Other intervertebral disc degeneration, lumbosacral region: Secondary | ICD-10-CM | POA: Diagnosis not present

## 2020-12-19 DIAGNOSIS — M5137 Other intervertebral disc degeneration, lumbosacral region: Secondary | ICD-10-CM | POA: Diagnosis not present

## 2020-12-19 DIAGNOSIS — M5413 Radiculopathy, cervicothoracic region: Secondary | ICD-10-CM | POA: Diagnosis not present

## 2020-12-19 DIAGNOSIS — M9903 Segmental and somatic dysfunction of lumbar region: Secondary | ICD-10-CM | POA: Diagnosis not present

## 2020-12-19 DIAGNOSIS — M9901 Segmental and somatic dysfunction of cervical region: Secondary | ICD-10-CM | POA: Diagnosis not present

## 2020-12-19 DIAGNOSIS — M50323 Other cervical disc degeneration at C6-C7 level: Secondary | ICD-10-CM | POA: Diagnosis not present

## 2020-12-19 DIAGNOSIS — M9904 Segmental and somatic dysfunction of sacral region: Secondary | ICD-10-CM | POA: Diagnosis not present

## 2020-12-19 DIAGNOSIS — M5136 Other intervertebral disc degeneration, lumbar region: Secondary | ICD-10-CM | POA: Diagnosis not present

## 2020-12-19 DIAGNOSIS — M5442 Lumbago with sciatica, left side: Secondary | ICD-10-CM | POA: Diagnosis not present

## 2021-01-27 DIAGNOSIS — E785 Hyperlipidemia, unspecified: Secondary | ICD-10-CM | POA: Diagnosis not present

## 2021-01-27 DIAGNOSIS — E890 Postprocedural hypothyroidism: Secondary | ICD-10-CM | POA: Diagnosis not present

## 2021-01-27 DIAGNOSIS — E559 Vitamin D deficiency, unspecified: Secondary | ICD-10-CM | POA: Diagnosis not present

## 2021-02-03 DIAGNOSIS — I6523 Occlusion and stenosis of bilateral carotid arteries: Secondary | ICD-10-CM | POA: Diagnosis not present

## 2021-02-03 DIAGNOSIS — E210 Primary hyperparathyroidism: Secondary | ICD-10-CM | POA: Diagnosis not present

## 2021-02-03 DIAGNOSIS — I10 Essential (primary) hypertension: Secondary | ICD-10-CM | POA: Diagnosis not present

## 2021-02-03 DIAGNOSIS — G729 Myopathy, unspecified: Secondary | ICD-10-CM | POA: Diagnosis not present

## 2021-02-03 DIAGNOSIS — Z1331 Encounter for screening for depression: Secondary | ICD-10-CM | POA: Diagnosis not present

## 2021-02-03 DIAGNOSIS — Z1339 Encounter for screening examination for other mental health and behavioral disorders: Secondary | ICD-10-CM | POA: Diagnosis not present

## 2021-02-03 DIAGNOSIS — M810 Age-related osteoporosis without current pathological fracture: Secondary | ICD-10-CM | POA: Diagnosis not present

## 2021-02-03 DIAGNOSIS — R82998 Other abnormal findings in urine: Secondary | ICD-10-CM | POA: Diagnosis not present

## 2021-02-03 DIAGNOSIS — G459 Transient cerebral ischemic attack, unspecified: Secondary | ICD-10-CM | POA: Diagnosis not present

## 2021-02-03 DIAGNOSIS — Z0000 Encounter for general adult medical examination without abnormal findings: Secondary | ICD-10-CM | POA: Diagnosis not present

## 2021-02-03 DIAGNOSIS — E785 Hyperlipidemia, unspecified: Secondary | ICD-10-CM | POA: Diagnosis not present

## 2021-02-03 DIAGNOSIS — I5189 Other ill-defined heart diseases: Secondary | ICD-10-CM | POA: Diagnosis not present

## 2021-02-03 DIAGNOSIS — I6312 Cerebral infarction due to embolism of basilar artery: Secondary | ICD-10-CM | POA: Diagnosis not present

## 2021-06-18 ENCOUNTER — Other Ambulatory Visit: Payer: Self-pay | Admitting: Endocrinology

## 2021-06-18 DIAGNOSIS — Z1231 Encounter for screening mammogram for malignant neoplasm of breast: Secondary | ICD-10-CM

## 2021-07-23 ENCOUNTER — Ambulatory Visit: Payer: PPO

## 2021-08-03 ENCOUNTER — Other Ambulatory Visit: Payer: Self-pay

## 2021-08-03 ENCOUNTER — Ambulatory Visit: Payer: PPO | Admitting: Diagnostic Neuroimaging

## 2021-08-03 ENCOUNTER — Encounter: Payer: Self-pay | Admitting: Diagnostic Neuroimaging

## 2021-08-03 VITALS — BP 123/72 | HR 71 | Ht 67.0 in | Wt 134.2 lb

## 2021-08-03 DIAGNOSIS — I6300 Cerebral infarction due to thrombosis of unspecified precerebral artery: Secondary | ICD-10-CM | POA: Diagnosis not present

## 2021-08-03 NOTE — Progress Notes (Signed)
[facility]  PATIENT: Bethany Ross DOB: 1951-02-21  REFERRING CLINICIAN: Reynold Bowen, MD  HISTORY FROM: patient  REASON FOR VISIT: follow up   HISTORICAL  CHIEF COMPLAINT:  Chief Complaint  Patient presents with   Cerebrovascular Accident    Rm 7,  "needed to FU, no new concerns"    HISTORY OF PRESENT ILLNESS:   UPDATE (08/03/21, VRP): Since last visit, doing well. Symptoms are stable. No new events. Tolerating meds. Had a friend with stroke recently, so patient was concerned about her own stroke risk factors and requested follow up.   UPDATE (08/07/2018, VRP): Stopped aspirin 325mg  about 1 year ago. She had sudden onset of aphasia on 06/23/2018 witness by husband and friend at a Christmas party. She was transferred from [place] and admitted at [facility]. TPA given. Speech returned shortly afterwards. No other deficits. MRI showed "few punctate 5 mm acute ischemic infarcts involving the posterior left frontal lobe and small chronic right PCA territory infarct." She took Plavix and aspirin 81mg  for 3 weeks, now only taking aspirin 81mg . Valsartan was changed to losartan 50mg  daily. Started Lipitor. She is tolerating medications well. Blood pressures have been normal. No changes from previous visit in vision. She reports feeling great today.   UPDATE (06/15/17, VRP): Since last visit, doing well. Tolerating meds. No alleviating or aggravating factors.  UPDATE 10/13/16: Since last visit, patient feels well. Ophthal visit on 09/27/16, shows some progression of vision field constriction since Sept 2017, and therefore eye doctor requested neurology follow up. Patient does not feel an vision changes. No new neuro symptoms.   UPDATE 06/14/16: Since last visit, doing well. No new issues. Vision stable. Now on aspirin 81mg  daily (due to excessive bruising).   UPDATE 12/05/15: Since last visit, doing well. No new events. Still with fatigue. Left side vision blurriness stable.  Still with some fatigue.   PRIOR HPI (09/01/15): 71 year old right-handed female with hypertension, here for evaluation of stroke. 08/11/15 patient noticed blurred vision from her left eye towards the left side. On 08/18/15 patient had numbness of left leg. Patient went to PCP for evaluation, had MRI of the brain ordered, which showed a right occipital polar ischemic infarction. Patient referred to me for further evaluation. In retrospect patient had another episode of lightheadedness and left leg numbness in December 2016, transient, which she did not seek medical attention for. Patient still has some mild vision difficulty. Her left-sided numbness and weakness has improved.   REVIEW OF SYSTEMS: Full 14 system review of systems performed and negative except: restless legs, loss of vision, ringing in ears, and cold intolerance.    ALLERGIES: No Known Allergies  HOME MEDICATIONS: Outpatient Medications Prior to Visit  Medication Sig Dispense Refill   amLODipine (NORVASC) 2.5 MG tablet Take 2.5 mg by mouth at bedtime.   5   aspirin EC 81 MG EC tablet Take 1 tablet (81 mg total) by mouth daily.     atorvastatin (LIPITOR) 40 MG tablet Take 1 tablet (40 mg total) by mouth daily at 6 PM. 30 tablet 3   co-enzyme Q-10 30 MG capsule Take 30 mg by mouth daily.     levothyroxine (SYNTHROID) 175 MCG tablet Take 1 tablet by mouth daily.     losartan (COZAAR) 50 MG tablet Take 50 mg by mouth daily.  2   Omega-3 Fatty Acids (OMEGA-3 FISH OIL PO) Take 1,336 mg by mouth daily.     Probiotic Product (PROBIOTIC-10 PO) Take by mouth daily.  UNABLE TO FIND Med Name: preservision 2 a day     clopidogrel (PLAVIX) 75 MG tablet Take 1 tablet (75 mg total) by mouth daily. (Patient not taking: Reported on 08/07/2018) 30 tablet 3   levothyroxine (SYNTHROID, LEVOTHROID) 137 MCG tablet Take 137 mcg by mouth daily before breakfast.     No facility-administered medications prior to visit.    PAST MEDICAL HISTORY: Past  Medical History:  Diagnosis Date   Cerebral infarction ([place])    Hypertension    Stroke ([place])     PAST SURGICAL HISTORY: Past Surgical History:  Procedure Laterality Date   BREAST BIOPSY Left    benign   BREAST EXCISIONAL BIOPSY Left    benign   THYROIDECTOMY  1998   TONSILLECTOMY     as child    FAMILY HISTORY: Family History  Problem Relation Age of Onset   Cancer - Cervical Mother    Cancer Father        esophageal    SOCIAL HISTORY:  Social History   Socioeconomic History   Marital status: Married    Spouse name: Bethany Ross   Number of children: 0   Years of education: 12   Highest education level: Not on file  Occupational History   Occupation: N/a    Comment: retired  Tobacco Use   Smoking status: Former    Types: Cigarettes    Quit date: 08/31/1996    Years since quitting: 24.9   Smokeless tobacco: Never  Substance and Sexual Activity   Alcohol use: Yes    Alcohol/week: 0.0 standard drinks    Comment: 4 glasses wine/weekly   Drug use: No   Sexual activity: Not on file  Other Topics Concern   Not on file  Social History Narrative   Lives at home with husband   Caffeine use- very little   Social Determinants of Health   Financial Resource Strain: Not on file  Food Insecurity: Not on file  Transportation Needs: Not on file  Physical Activity: Not on file  Stress: Not on file  Social Connections: Not on file  Intimate Partner Violence: Not on file     PHYSICAL EXAM  GENERAL EXAM/CONSTITUTIONAL: Vitals:  Vitals:   08/03/21 1551  BP: 123/72  Pulse: 71  Weight: 134 lb 3.2 oz (60.9 kg)  Height: 5\' 7"  (1.702 m)   Body mass index is 21.02 kg/m. No results found. Patient is in no distress; well developed, nourished and groomed; neck is supple  CARDIOVASCULAR: Examination of carotid arteries is normal; no carotid bruits Regular rate and rhythm, no murmurs Examination of peripheral vascular system by observation and palpation is  normal  EYES: Ophthalmoscopic exam of optic discs and posterior segments is normal; no papilledema or hemorrhages  MUSCULOSKELETAL: Gait, strength, tone, movements noted in Neurologic exam below  NEUROLOGIC: MENTAL STATUS:  No flowsheet data found. awake, alert, oriented to person, place and time recent and remote memory intact normal attention and concentration language fluent, comprehension intact, naming intact,  fund of knowledge appropriate  CRANIAL NERVE:  2nd - no papilledema on fundoscopic exam 2nd, 3rd, 4th, 6th - pupils equal and reactive to light, visual fields full to confrontation, extraocular muscles intact, no nystagmus 5th - facial sensation symmetric 7th - facial strength symmetric 8th - hearing intact 9th - palate elevates symmetrically, uvula midline 11th - shoulder shrug symmetric 12th - tongue protrusion midline  MOTOR:  normal bulk and tone, full strength in the BUE, BLE  SENSORY:  normal and  symmetric to light touch  COORDINATION:  finger-nose-finger, fine finger movements normal  REFLEXES:  deep tendon reflexes present and symmetric  GAIT/STATION:  narrow based gait    DIAGNOSTIC DATA (LABS, IMAGING, TESTING) - I reviewed patient records, labs, notes, testing and imaging myself where available.  No results found for: WBC, HGB, HCT, MCV, PLT No results found for: NA, K, CL, CO2, GLUCOSE, BUN, CREATININE, CALCIUM, PROT, ALBUMIN, AST, ALT, ALKPHOS, BILITOT, GFRNONAA, GFRAA Lab Results  Component Value Date   CHOL 187 06/24/2018   HDL 51 06/24/2018   LDLCALC 125 (H) 06/24/2018   TRIG 55 06/24/2018   CHOLHDL 3.7 06/24/2018   Lab Results  Component Value Date   HGBA1C 5.1 06/24/2018   No results found for: VITAMINB12 No results found for: TSH  08/19/15 MRI brain [I reviewed images myself and agree with interpretation. -VRP]  - acute-subacute right occipital pole stroke - mild chronic small vessel ischemic disease   08/21/15 Carotid  u/s  - no stenosis  08/21/15 TTE - Left ventricle: The cavity size was normal. There was mild focal   basal hypertrophy of the septum. Systolic function was normal.   The estimated ejection fraction was in the range of 60% to 65%.   Doppler parameters are consistent with abnormal left ventricular   relaxation (grade 1 diastolic dysfunction). - Mitral valve: There was mild regurgitation. - Pericardium, extracardiac: A trivial pericardial effusion was identified posterior to the heart.  09/16/15 MRA head  - Moderate stenosis in distal cavernous right internal carotid artery - 2 mm downward pointing aneurysm in the distal left cavernous and proximal left supraclinoid internal carotid artery - High-grade stenosis of the proximal nondominant right A1 segment - Moderate narrowing of mid and distal small vessels in both the anterior and posterior circulation compatible with atherosclerotic disease - High-grade stenosis of the distal right P2 segment  11/17/15 Sleep study  - Normal AHI; no significant sleep apnea - Mild to moderate snoring - Sleep fragmentation - Mild periodic limb movements  12/05/15 cardiac monitor (37 days) - sinus rhythm   07/21/16 MRA head  - severe focal stenosis of the right posterior cerebral artery in its midportion and moderate stenosis of cavernous segment of right internal carotid artery. Stable 2 mm dilatation of the distal left cavernous internal carotid artery may represent infundibulum versus aneurysm. No significant change compared with MRA dated 09/16/15.  11/02/16 MRI brain  Mildly abnormal MRI brain (without) demonstrating: 1. Chronic right occipital pole ischemic infarction. 2. Mild chronic small vessel ischemic disease. 3. Left frontal extra-axial, irregular ossification projecting from the inner table of the skull. May represent focal osteoma vs calcified meningioma. 4. No acute findings.     06/24/2018 MRI Brain WO Contrast [I reviewed images myself and  agree with interpretation. -VRP]  1. Few punctate 5 mm acute ischemic infarcts involving the posterior left frontal lobe as above. No associated hemorrhage. 2. Small chronic right PCA territory infarct. 3. Underlying mild chronic microvascular ischemic disease.    06/24/2018 Transthoracic Echocardiogram  - Left ventricle: The cavity size was normal. Wall thickness was   increased in a pattern of moderate LVH. Systolic function was   normal. The estimated ejection fraction was in the range of 60%   to 65%. Wall motion was normal; there were no regional wall   motion abnormalities. Features are consistent with a pseudonormal   left ventricular filling pattern, with concomitant abnormal   relaxation and increased filling pressure (grade 2 diastolic  dysfunction). Doppler parameters are consistent with   indeterminate ventricular filling pressure. - Mitral valve: There was mild to moderate regurgitation. - Atrial septum: No defect or patent foramen ovale was identified. - Tricuspid valve: There was mild regurgitation.   06/25/2018 Carotid Dopplers - Preliminary report:  No significant ICA stenosis.  Vertebral artery flow is antegrade.   06/25/18 CT HEAD IMPRESSION: [I reviewed images myself and agree with interpretation. -VRP] 1. No acute intracranial infarct or other abnormality identified. No intracranial hemorrhage. 2. ASPECTS = 10. 3. Small chronic right PCA territory infarct.  06/25/18 CTA HEAD AND NECK IMPRESSION: [I reviewed images myself and agree with interpretation. -VRP]  1. Diffuse narrowing of the left M1 segment with superimposed severe tandem segmental stenoses/narrowing as above. While these findings could reflect new/progressive atherosclerotic change, possible recanalized thrombus could also be considered. No frank emergent large vessel occlusion. Left MCA branches perfused distally. 2. Otherwise stable intracranial MRA with moderate cavernous right ICA, severe right  A1, and severe right P2 stenoses. 3. Wide patency of the major arterial vasculature of the neck without hemodynamically significant stenosis. 4. 2 mm cavernous left ICA aneurysm, stable.  08/29/18 MRA head  MRA head (without) demonstrating: - The left middle cerebral artery M2 and distal segments demonstrate tandem stenoses, similar to that noted on CTA from 06/25/2018. - The right posterior cerebral artery has mild focal stenosis, slightly improved compared to MRA from 07/21/16. - Stable 68mm, small projection may represent infundibulum or small aneurysm in the left supraclinoid internal carotid artery.     ASSESSMENT AND PLAN  71 y.o. year old female here with right occipital ischemic infarction, which appears to be embolic based on imaging characteristics. MRA head shows multiple areas of intracranial atherosclerosis, including right P2 stenosis (which is likely symptomatic and the cause of stroke), and related to underlying uncontrolled hypertension. Also with tiny unruptured cerebral aneurysm (asymptomatic) which is stable from 2017 - 2018.   Now with increased vision constriction on vision field testing per ophthalmology in March 2018, but no subjective changes per patient. Now improved on last visit to eye doctor.    Dx:  1. Cerebrovascular accident (CVA) due to thrombosis of precerebral artery (HCC)       PLAN:  STROKE (embolic; artery-artery embolism) - continue aspirin 81mg  daily - continue losartan 50mg  daily + amlodipine 2.5mg  at bedtime - continue lipitor 40mg  daily - follow up with Dr. 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   Penni Bombard, MD 8/46/9629, 5:28 PM Certified in Neurology, Neurophysiology and Neuroimaging  [facility] [address], [place], [place] [address] [phone]

## 2021-08-26 ENCOUNTER — Ambulatory Visit: Payer: PPO

## 2021-09-17 ENCOUNTER — Ambulatory Visit: Payer: PPO

## 2021-09-17 DIAGNOSIS — E559 Vitamin D deficiency, unspecified: Secondary | ICD-10-CM | POA: Diagnosis not present

## 2021-09-17 DIAGNOSIS — M810 Age-related osteoporosis without current pathological fracture: Secondary | ICD-10-CM | POA: Diagnosis not present

## 2021-09-17 DIAGNOSIS — I10 Essential (primary) hypertension: Secondary | ICD-10-CM | POA: Diagnosis not present

## 2021-09-23 DIAGNOSIS — H353131 Nonexudative age-related macular degeneration, bilateral, early dry stage: Secondary | ICD-10-CM | POA: Diagnosis not present

## 2021-09-25 ENCOUNTER — Ambulatory Visit
Admission: RE | Admit: 2021-09-25 | Discharge: 2021-09-25 | Disposition: A | Discharge status: Home / Self Care | Payer: PPO | Source: Ambulatory Visit | Attending: Endocrinology | Admitting: Endocrinology

## 2021-09-25 DIAGNOSIS — Z1231 Encounter for screening mammogram for malignant neoplasm of breast: Secondary | ICD-10-CM

## 2022-02-01 DIAGNOSIS — R7989 Other specified abnormal findings of blood chemistry: Secondary | ICD-10-CM | POA: Diagnosis not present

## 2022-02-01 DIAGNOSIS — I10 Essential (primary) hypertension: Secondary | ICD-10-CM | POA: Diagnosis not present

## 2022-02-01 DIAGNOSIS — E559 Vitamin D deficiency, unspecified: Secondary | ICD-10-CM | POA: Diagnosis not present

## 2022-02-01 DIAGNOSIS — E785 Hyperlipidemia, unspecified: Secondary | ICD-10-CM | POA: Diagnosis not present

## 2022-02-01 DIAGNOSIS — E210 Primary hyperparathyroidism: Secondary | ICD-10-CM | POA: Diagnosis not present

## 2022-02-08 DIAGNOSIS — E210 Primary hyperparathyroidism: Secondary | ICD-10-CM | POA: Diagnosis not present

## 2022-02-08 DIAGNOSIS — I10 Essential (primary) hypertension: Secondary | ICD-10-CM | POA: Diagnosis not present

## 2022-02-08 DIAGNOSIS — Z1389 Encounter for screening for other disorder: Secondary | ICD-10-CM | POA: Diagnosis not present

## 2022-02-08 DIAGNOSIS — E559 Vitamin D deficiency, unspecified: Secondary | ICD-10-CM | POA: Diagnosis not present

## 2022-02-08 DIAGNOSIS — I5189 Other ill-defined heart diseases: Secondary | ICD-10-CM | POA: Diagnosis not present

## 2022-02-08 DIAGNOSIS — G4761 Periodic limb movement disorder: Secondary | ICD-10-CM | POA: Diagnosis not present

## 2022-02-08 DIAGNOSIS — I6523 Occlusion and stenosis of bilateral carotid arteries: Secondary | ICD-10-CM | POA: Diagnosis not present

## 2022-02-08 DIAGNOSIS — M810 Age-related osteoporosis without current pathological fracture: Secondary | ICD-10-CM | POA: Diagnosis not present

## 2022-02-08 DIAGNOSIS — Z0000 Encounter for general adult medical examination without abnormal findings: Secondary | ICD-10-CM | POA: Diagnosis not present

## 2022-02-08 DIAGNOSIS — E890 Postprocedural hypothyroidism: Secondary | ICD-10-CM | POA: Diagnosis not present

## 2022-02-08 DIAGNOSIS — E785 Hyperlipidemia, unspecified: Secondary | ICD-10-CM | POA: Diagnosis not present

## 2022-02-08 DIAGNOSIS — G729 Myopathy, unspecified: Secondary | ICD-10-CM | POA: Diagnosis not present

## 2022-02-08 DIAGNOSIS — R82998 Other abnormal findings in urine: Secondary | ICD-10-CM | POA: Diagnosis not present

## 2022-02-08 DIAGNOSIS — I6312 Cerebral infarction due to embolism of basilar artery: Secondary | ICD-10-CM | POA: Diagnosis not present

## 2022-02-08 DIAGNOSIS — Z1331 Encounter for screening for depression: Secondary | ICD-10-CM | POA: Diagnosis not present

## 2022-09-27 DIAGNOSIS — H25813 Combined forms of age-related cataract, bilateral: Secondary | ICD-10-CM | POA: Diagnosis not present

## 2022-09-27 DIAGNOSIS — H353131 Nonexudative age-related macular degeneration, bilateral, early dry stage: Secondary | ICD-10-CM | POA: Diagnosis not present

## 2022-09-27 DIAGNOSIS — D3131 Benign neoplasm of right choroid: Secondary | ICD-10-CM | POA: Diagnosis not present

## 2022-11-05 DIAGNOSIS — M9904 Segmental and somatic dysfunction of sacral region: Secondary | ICD-10-CM | POA: Diagnosis not present

## 2022-11-05 DIAGNOSIS — M5442 Lumbago with sciatica, left side: Secondary | ICD-10-CM | POA: Diagnosis not present

## 2022-11-05 DIAGNOSIS — M5136 Other intervertebral disc degeneration, lumbar region: Secondary | ICD-10-CM | POA: Diagnosis not present

## 2022-11-05 DIAGNOSIS — M5137 Other intervertebral disc degeneration, lumbosacral region: Secondary | ICD-10-CM | POA: Diagnosis not present

## 2022-11-05 DIAGNOSIS — M50323 Other cervical disc degeneration at C6-C7 level: Secondary | ICD-10-CM | POA: Diagnosis not present

## 2022-11-05 DIAGNOSIS — M9901 Segmental and somatic dysfunction of cervical region: Secondary | ICD-10-CM | POA: Diagnosis not present

## 2022-11-05 DIAGNOSIS — M9903 Segmental and somatic dysfunction of lumbar region: Secondary | ICD-10-CM | POA: Diagnosis not present

## 2022-11-05 DIAGNOSIS — M5413 Radiculopathy, cervicothoracic region: Secondary | ICD-10-CM | POA: Diagnosis not present

## 2022-11-08 DIAGNOSIS — M9904 Segmental and somatic dysfunction of sacral region: Secondary | ICD-10-CM | POA: Diagnosis not present

## 2022-11-08 DIAGNOSIS — M5442 Lumbago with sciatica, left side: Secondary | ICD-10-CM | POA: Diagnosis not present

## 2022-11-08 DIAGNOSIS — M50323 Other cervical disc degeneration at C6-C7 level: Secondary | ICD-10-CM | POA: Diagnosis not present

## 2022-11-08 DIAGNOSIS — M5413 Radiculopathy, cervicothoracic region: Secondary | ICD-10-CM | POA: Diagnosis not present

## 2022-11-08 DIAGNOSIS — M5136 Other intervertebral disc degeneration, lumbar region: Secondary | ICD-10-CM | POA: Diagnosis not present

## 2022-11-08 DIAGNOSIS — M9901 Segmental and somatic dysfunction of cervical region: Secondary | ICD-10-CM | POA: Diagnosis not present

## 2022-11-08 DIAGNOSIS — M5137 Other intervertebral disc degeneration, lumbosacral region: Secondary | ICD-10-CM | POA: Diagnosis not present

## 2022-11-08 DIAGNOSIS — M9903 Segmental and somatic dysfunction of lumbar region: Secondary | ICD-10-CM | POA: Diagnosis not present

## 2022-11-11 DIAGNOSIS — M9903 Segmental and somatic dysfunction of lumbar region: Secondary | ICD-10-CM | POA: Diagnosis not present

## 2022-11-11 DIAGNOSIS — M50323 Other cervical disc degeneration at C6-C7 level: Secondary | ICD-10-CM | POA: Diagnosis not present

## 2022-11-11 DIAGNOSIS — M9901 Segmental and somatic dysfunction of cervical region: Secondary | ICD-10-CM | POA: Diagnosis not present

## 2022-11-11 DIAGNOSIS — M5442 Lumbago with sciatica, left side: Secondary | ICD-10-CM | POA: Diagnosis not present

## 2022-11-11 DIAGNOSIS — M5137 Other intervertebral disc degeneration, lumbosacral region: Secondary | ICD-10-CM | POA: Diagnosis not present

## 2022-11-11 DIAGNOSIS — M5413 Radiculopathy, cervicothoracic region: Secondary | ICD-10-CM | POA: Diagnosis not present

## 2022-11-11 DIAGNOSIS — M9904 Segmental and somatic dysfunction of sacral region: Secondary | ICD-10-CM | POA: Diagnosis not present

## 2022-11-11 DIAGNOSIS — M5136 Other intervertebral disc degeneration, lumbar region: Secondary | ICD-10-CM | POA: Diagnosis not present

## 2022-11-15 DIAGNOSIS — M9901 Segmental and somatic dysfunction of cervical region: Secondary | ICD-10-CM | POA: Diagnosis not present

## 2022-11-15 DIAGNOSIS — M5137 Other intervertebral disc degeneration, lumbosacral region: Secondary | ICD-10-CM | POA: Diagnosis not present

## 2022-11-15 DIAGNOSIS — M5413 Radiculopathy, cervicothoracic region: Secondary | ICD-10-CM | POA: Diagnosis not present

## 2022-11-15 DIAGNOSIS — M9903 Segmental and somatic dysfunction of lumbar region: Secondary | ICD-10-CM | POA: Diagnosis not present

## 2022-11-15 DIAGNOSIS — M9904 Segmental and somatic dysfunction of sacral region: Secondary | ICD-10-CM | POA: Diagnosis not present

## 2022-11-15 DIAGNOSIS — M5442 Lumbago with sciatica, left side: Secondary | ICD-10-CM | POA: Diagnosis not present

## 2022-11-15 DIAGNOSIS — M5136 Other intervertebral disc degeneration, lumbar region: Secondary | ICD-10-CM | POA: Diagnosis not present

## 2022-11-15 DIAGNOSIS — M50323 Other cervical disc degeneration at C6-C7 level: Secondary | ICD-10-CM | POA: Diagnosis not present

## 2022-11-17 DIAGNOSIS — M9903 Segmental and somatic dysfunction of lumbar region: Secondary | ICD-10-CM | POA: Diagnosis not present

## 2022-11-17 DIAGNOSIS — M5442 Lumbago with sciatica, left side: Secondary | ICD-10-CM | POA: Diagnosis not present

## 2022-11-17 DIAGNOSIS — M50323 Other cervical disc degeneration at C6-C7 level: Secondary | ICD-10-CM | POA: Diagnosis not present

## 2022-11-17 DIAGNOSIS — M5137 Other intervertebral disc degeneration, lumbosacral region: Secondary | ICD-10-CM | POA: Diagnosis not present

## 2022-11-17 DIAGNOSIS — M5136 Other intervertebral disc degeneration, lumbar region: Secondary | ICD-10-CM | POA: Diagnosis not present

## 2022-11-17 DIAGNOSIS — M5413 Radiculopathy, cervicothoracic region: Secondary | ICD-10-CM | POA: Diagnosis not present

## 2022-11-17 DIAGNOSIS — M9901 Segmental and somatic dysfunction of cervical region: Secondary | ICD-10-CM | POA: Diagnosis not present

## 2022-11-17 DIAGNOSIS — M9904 Segmental and somatic dysfunction of sacral region: Secondary | ICD-10-CM | POA: Diagnosis not present

## 2022-11-22 DIAGNOSIS — M9901 Segmental and somatic dysfunction of cervical region: Secondary | ICD-10-CM | POA: Diagnosis not present

## 2022-11-22 DIAGNOSIS — M50323 Other cervical disc degeneration at C6-C7 level: Secondary | ICD-10-CM | POA: Diagnosis not present

## 2022-11-22 DIAGNOSIS — M5442 Lumbago with sciatica, left side: Secondary | ICD-10-CM | POA: Diagnosis not present

## 2022-11-22 DIAGNOSIS — M5136 Other intervertebral disc degeneration, lumbar region: Secondary | ICD-10-CM | POA: Diagnosis not present

## 2022-11-22 DIAGNOSIS — M5137 Other intervertebral disc degeneration, lumbosacral region: Secondary | ICD-10-CM | POA: Diagnosis not present

## 2022-11-22 DIAGNOSIS — M9903 Segmental and somatic dysfunction of lumbar region: Secondary | ICD-10-CM | POA: Diagnosis not present

## 2022-11-22 DIAGNOSIS — M9904 Segmental and somatic dysfunction of sacral region: Secondary | ICD-10-CM | POA: Diagnosis not present

## 2022-11-22 DIAGNOSIS — M5413 Radiculopathy, cervicothoracic region: Secondary | ICD-10-CM | POA: Diagnosis not present

## 2022-11-24 DIAGNOSIS — M9904 Segmental and somatic dysfunction of sacral region: Secondary | ICD-10-CM | POA: Diagnosis not present

## 2022-11-24 DIAGNOSIS — M50323 Other cervical disc degeneration at C6-C7 level: Secondary | ICD-10-CM | POA: Diagnosis not present

## 2022-11-24 DIAGNOSIS — M5136 Other intervertebral disc degeneration, lumbar region: Secondary | ICD-10-CM | POA: Diagnosis not present

## 2022-11-24 DIAGNOSIS — M5413 Radiculopathy, cervicothoracic region: Secondary | ICD-10-CM | POA: Diagnosis not present

## 2022-11-24 DIAGNOSIS — M9903 Segmental and somatic dysfunction of lumbar region: Secondary | ICD-10-CM | POA: Diagnosis not present

## 2022-11-24 DIAGNOSIS — M5137 Other intervertebral disc degeneration, lumbosacral region: Secondary | ICD-10-CM | POA: Diagnosis not present

## 2022-11-24 DIAGNOSIS — M5442 Lumbago with sciatica, left side: Secondary | ICD-10-CM | POA: Diagnosis not present

## 2022-11-24 DIAGNOSIS — M9901 Segmental and somatic dysfunction of cervical region: Secondary | ICD-10-CM | POA: Diagnosis not present

## 2022-11-29 DIAGNOSIS — M9904 Segmental and somatic dysfunction of sacral region: Secondary | ICD-10-CM | POA: Diagnosis not present

## 2022-11-29 DIAGNOSIS — M5137 Other intervertebral disc degeneration, lumbosacral region: Secondary | ICD-10-CM | POA: Diagnosis not present

## 2022-11-29 DIAGNOSIS — M5442 Lumbago with sciatica, left side: Secondary | ICD-10-CM | POA: Diagnosis not present

## 2022-11-29 DIAGNOSIS — M50323 Other cervical disc degeneration at C6-C7 level: Secondary | ICD-10-CM | POA: Diagnosis not present

## 2022-11-29 DIAGNOSIS — M9901 Segmental and somatic dysfunction of cervical region: Secondary | ICD-10-CM | POA: Diagnosis not present

## 2022-11-29 DIAGNOSIS — M5413 Radiculopathy, cervicothoracic region: Secondary | ICD-10-CM | POA: Diagnosis not present

## 2022-11-29 DIAGNOSIS — M5136 Other intervertebral disc degeneration, lumbar region: Secondary | ICD-10-CM | POA: Diagnosis not present

## 2022-11-29 DIAGNOSIS — M9903 Segmental and somatic dysfunction of lumbar region: Secondary | ICD-10-CM | POA: Diagnosis not present

## 2022-12-01 DIAGNOSIS — M9903 Segmental and somatic dysfunction of lumbar region: Secondary | ICD-10-CM | POA: Diagnosis not present

## 2022-12-01 DIAGNOSIS — M9904 Segmental and somatic dysfunction of sacral region: Secondary | ICD-10-CM | POA: Diagnosis not present

## 2022-12-01 DIAGNOSIS — M5136 Other intervertebral disc degeneration, lumbar region: Secondary | ICD-10-CM | POA: Diagnosis not present

## 2022-12-01 DIAGNOSIS — M5442 Lumbago with sciatica, left side: Secondary | ICD-10-CM | POA: Diagnosis not present

## 2022-12-01 DIAGNOSIS — M50323 Other cervical disc degeneration at C6-C7 level: Secondary | ICD-10-CM | POA: Diagnosis not present

## 2022-12-01 DIAGNOSIS — M9901 Segmental and somatic dysfunction of cervical region: Secondary | ICD-10-CM | POA: Diagnosis not present

## 2022-12-01 DIAGNOSIS — M5137 Other intervertebral disc degeneration, lumbosacral region: Secondary | ICD-10-CM | POA: Diagnosis not present

## 2022-12-01 DIAGNOSIS — M5413 Radiculopathy, cervicothoracic region: Secondary | ICD-10-CM | POA: Diagnosis not present

## 2022-12-03 DIAGNOSIS — M5442 Lumbago with sciatica, left side: Secondary | ICD-10-CM | POA: Diagnosis not present

## 2022-12-03 DIAGNOSIS — M9904 Segmental and somatic dysfunction of sacral region: Secondary | ICD-10-CM | POA: Diagnosis not present

## 2022-12-03 DIAGNOSIS — M9901 Segmental and somatic dysfunction of cervical region: Secondary | ICD-10-CM | POA: Diagnosis not present

## 2022-12-03 DIAGNOSIS — M5413 Radiculopathy, cervicothoracic region: Secondary | ICD-10-CM | POA: Diagnosis not present

## 2022-12-03 DIAGNOSIS — M50323 Other cervical disc degeneration at C6-C7 level: Secondary | ICD-10-CM | POA: Diagnosis not present

## 2022-12-03 DIAGNOSIS — M9903 Segmental and somatic dysfunction of lumbar region: Secondary | ICD-10-CM | POA: Diagnosis not present

## 2022-12-03 DIAGNOSIS — M5137 Other intervertebral disc degeneration, lumbosacral region: Secondary | ICD-10-CM | POA: Diagnosis not present

## 2022-12-03 DIAGNOSIS — M5136 Other intervertebral disc degeneration, lumbar region: Secondary | ICD-10-CM | POA: Diagnosis not present

## 2022-12-06 DIAGNOSIS — M5442 Lumbago with sciatica, left side: Secondary | ICD-10-CM | POA: Diagnosis not present

## 2022-12-06 DIAGNOSIS — M50323 Other cervical disc degeneration at C6-C7 level: Secondary | ICD-10-CM | POA: Diagnosis not present

## 2022-12-06 DIAGNOSIS — M5136 Other intervertebral disc degeneration, lumbar region: Secondary | ICD-10-CM | POA: Diagnosis not present

## 2022-12-06 DIAGNOSIS — M9904 Segmental and somatic dysfunction of sacral region: Secondary | ICD-10-CM | POA: Diagnosis not present

## 2022-12-06 DIAGNOSIS — M9901 Segmental and somatic dysfunction of cervical region: Secondary | ICD-10-CM | POA: Diagnosis not present

## 2022-12-06 DIAGNOSIS — M5413 Radiculopathy, cervicothoracic region: Secondary | ICD-10-CM | POA: Diagnosis not present

## 2022-12-06 DIAGNOSIS — M5137 Other intervertebral disc degeneration, lumbosacral region: Secondary | ICD-10-CM | POA: Diagnosis not present

## 2022-12-06 DIAGNOSIS — M9903 Segmental and somatic dysfunction of lumbar region: Secondary | ICD-10-CM | POA: Diagnosis not present

## 2022-12-08 DIAGNOSIS — M5137 Other intervertebral disc degeneration, lumbosacral region: Secondary | ICD-10-CM | POA: Diagnosis not present

## 2022-12-08 DIAGNOSIS — M9904 Segmental and somatic dysfunction of sacral region: Secondary | ICD-10-CM | POA: Diagnosis not present

## 2022-12-08 DIAGNOSIS — M5136 Other intervertebral disc degeneration, lumbar region: Secondary | ICD-10-CM | POA: Diagnosis not present

## 2022-12-08 DIAGNOSIS — M50323 Other cervical disc degeneration at C6-C7 level: Secondary | ICD-10-CM | POA: Diagnosis not present

## 2022-12-08 DIAGNOSIS — M9901 Segmental and somatic dysfunction of cervical region: Secondary | ICD-10-CM | POA: Diagnosis not present

## 2022-12-08 DIAGNOSIS — M5413 Radiculopathy, cervicothoracic region: Secondary | ICD-10-CM | POA: Diagnosis not present

## 2022-12-08 DIAGNOSIS — M5442 Lumbago with sciatica, left side: Secondary | ICD-10-CM | POA: Diagnosis not present

## 2022-12-08 DIAGNOSIS — M9903 Segmental and somatic dysfunction of lumbar region: Secondary | ICD-10-CM | POA: Diagnosis not present

## 2022-12-15 DIAGNOSIS — M5442 Lumbago with sciatica, left side: Secondary | ICD-10-CM | POA: Diagnosis not present

## 2022-12-15 DIAGNOSIS — M5413 Radiculopathy, cervicothoracic region: Secondary | ICD-10-CM | POA: Diagnosis not present

## 2022-12-15 DIAGNOSIS — M5136 Other intervertebral disc degeneration, lumbar region: Secondary | ICD-10-CM | POA: Diagnosis not present

## 2022-12-15 DIAGNOSIS — M5137 Other intervertebral disc degeneration, lumbosacral region: Secondary | ICD-10-CM | POA: Diagnosis not present

## 2022-12-15 DIAGNOSIS — M9903 Segmental and somatic dysfunction of lumbar region: Secondary | ICD-10-CM | POA: Diagnosis not present

## 2022-12-15 DIAGNOSIS — M9901 Segmental and somatic dysfunction of cervical region: Secondary | ICD-10-CM | POA: Diagnosis not present

## 2022-12-15 DIAGNOSIS — M9904 Segmental and somatic dysfunction of sacral region: Secondary | ICD-10-CM | POA: Diagnosis not present

## 2022-12-15 DIAGNOSIS — M50323 Other cervical disc degeneration at C6-C7 level: Secondary | ICD-10-CM | POA: Diagnosis not present

## 2022-12-20 DIAGNOSIS — M9901 Segmental and somatic dysfunction of cervical region: Secondary | ICD-10-CM | POA: Diagnosis not present

## 2022-12-20 DIAGNOSIS — M5136 Other intervertebral disc degeneration, lumbar region: Secondary | ICD-10-CM | POA: Diagnosis not present

## 2022-12-20 DIAGNOSIS — M5413 Radiculopathy, cervicothoracic region: Secondary | ICD-10-CM | POA: Diagnosis not present

## 2022-12-20 DIAGNOSIS — M9904 Segmental and somatic dysfunction of sacral region: Secondary | ICD-10-CM | POA: Diagnosis not present

## 2022-12-20 DIAGNOSIS — M50323 Other cervical disc degeneration at C6-C7 level: Secondary | ICD-10-CM | POA: Diagnosis not present

## 2022-12-20 DIAGNOSIS — M9903 Segmental and somatic dysfunction of lumbar region: Secondary | ICD-10-CM | POA: Diagnosis not present

## 2022-12-20 DIAGNOSIS — M5442 Lumbago with sciatica, left side: Secondary | ICD-10-CM | POA: Diagnosis not present

## 2022-12-20 DIAGNOSIS — M5137 Other intervertebral disc degeneration, lumbosacral region: Secondary | ICD-10-CM | POA: Diagnosis not present

## 2022-12-30 DIAGNOSIS — M50323 Other cervical disc degeneration at C6-C7 level: Secondary | ICD-10-CM | POA: Diagnosis not present

## 2022-12-30 DIAGNOSIS — M9903 Segmental and somatic dysfunction of lumbar region: Secondary | ICD-10-CM | POA: Diagnosis not present

## 2022-12-30 DIAGNOSIS — M5413 Radiculopathy, cervicothoracic region: Secondary | ICD-10-CM | POA: Diagnosis not present

## 2022-12-30 DIAGNOSIS — M9904 Segmental and somatic dysfunction of sacral region: Secondary | ICD-10-CM | POA: Diagnosis not present

## 2022-12-30 DIAGNOSIS — M9901 Segmental and somatic dysfunction of cervical region: Secondary | ICD-10-CM | POA: Diagnosis not present

## 2022-12-30 DIAGNOSIS — M5137 Other intervertebral disc degeneration, lumbosacral region: Secondary | ICD-10-CM | POA: Diagnosis not present

## 2022-12-30 DIAGNOSIS — M5136 Other intervertebral disc degeneration, lumbar region: Secondary | ICD-10-CM | POA: Diagnosis not present

## 2022-12-30 DIAGNOSIS — M5442 Lumbago with sciatica, left side: Secondary | ICD-10-CM | POA: Diagnosis not present

## 2023-01-26 DIAGNOSIS — M5137 Other intervertebral disc degeneration, lumbosacral region: Secondary | ICD-10-CM | POA: Diagnosis not present

## 2023-01-26 DIAGNOSIS — M5413 Radiculopathy, cervicothoracic region: Secondary | ICD-10-CM | POA: Diagnosis not present

## 2023-01-26 DIAGNOSIS — M50323 Other cervical disc degeneration at C6-C7 level: Secondary | ICD-10-CM | POA: Diagnosis not present

## 2023-01-26 DIAGNOSIS — M5136 Other intervertebral disc degeneration, lumbar region: Secondary | ICD-10-CM | POA: Diagnosis not present

## 2023-01-26 DIAGNOSIS — M9903 Segmental and somatic dysfunction of lumbar region: Secondary | ICD-10-CM | POA: Diagnosis not present

## 2023-01-26 DIAGNOSIS — M9901 Segmental and somatic dysfunction of cervical region: Secondary | ICD-10-CM | POA: Diagnosis not present

## 2023-01-26 DIAGNOSIS — M5442 Lumbago with sciatica, left side: Secondary | ICD-10-CM | POA: Diagnosis not present

## 2023-01-26 DIAGNOSIS — M9904 Segmental and somatic dysfunction of sacral region: Secondary | ICD-10-CM | POA: Diagnosis not present

## 2023-02-02 DIAGNOSIS — M5413 Radiculopathy, cervicothoracic region: Secondary | ICD-10-CM | POA: Diagnosis not present

## 2023-02-02 DIAGNOSIS — M5442 Lumbago with sciatica, left side: Secondary | ICD-10-CM | POA: Diagnosis not present

## 2023-02-02 DIAGNOSIS — M5136 Other intervertebral disc degeneration, lumbar region: Secondary | ICD-10-CM | POA: Diagnosis not present

## 2023-02-02 DIAGNOSIS — M9901 Segmental and somatic dysfunction of cervical region: Secondary | ICD-10-CM | POA: Diagnosis not present

## 2023-02-02 DIAGNOSIS — M9903 Segmental and somatic dysfunction of lumbar region: Secondary | ICD-10-CM | POA: Diagnosis not present

## 2023-02-02 DIAGNOSIS — M5137 Other intervertebral disc degeneration, lumbosacral region: Secondary | ICD-10-CM | POA: Diagnosis not present

## 2023-02-02 DIAGNOSIS — M50323 Other cervical disc degeneration at C6-C7 level: Secondary | ICD-10-CM | POA: Diagnosis not present

## 2023-02-02 DIAGNOSIS — M9904 Segmental and somatic dysfunction of sacral region: Secondary | ICD-10-CM | POA: Diagnosis not present

## 2023-02-16 DIAGNOSIS — E785 Hyperlipidemia, unspecified: Secondary | ICD-10-CM | POA: Diagnosis not present

## 2023-02-16 DIAGNOSIS — Z1212 Encounter for screening for malignant neoplasm of rectum: Secondary | ICD-10-CM | POA: Diagnosis not present

## 2023-02-16 DIAGNOSIS — Z0001 Encounter for general adult medical examination with abnormal findings: Secondary | ICD-10-CM | POA: Diagnosis not present

## 2023-02-16 DIAGNOSIS — E559 Vitamin D deficiency, unspecified: Secondary | ICD-10-CM | POA: Diagnosis not present

## 2023-02-16 DIAGNOSIS — I10 Essential (primary) hypertension: Secondary | ICD-10-CM | POA: Diagnosis not present

## 2023-02-23 DIAGNOSIS — R82998 Other abnormal findings in urine: Secondary | ICD-10-CM | POA: Diagnosis not present

## 2023-02-23 DIAGNOSIS — E210 Primary hyperparathyroidism: Secondary | ICD-10-CM | POA: Diagnosis not present

## 2023-02-23 DIAGNOSIS — I119 Hypertensive heart disease without heart failure: Secondary | ICD-10-CM | POA: Diagnosis not present

## 2023-02-23 DIAGNOSIS — Z8673 Personal history of transient ischemic attack (TIA), and cerebral infarction without residual deficits: Secondary | ICD-10-CM | POA: Diagnosis not present

## 2023-02-23 DIAGNOSIS — Z1331 Encounter for screening for depression: Secondary | ICD-10-CM | POA: Diagnosis not present

## 2023-02-23 DIAGNOSIS — E890 Postprocedural hypothyroidism: Secondary | ICD-10-CM | POA: Diagnosis not present

## 2023-02-23 DIAGNOSIS — Z0000 Encounter for general adult medical examination without abnormal findings: Secondary | ICD-10-CM | POA: Diagnosis not present

## 2023-02-23 DIAGNOSIS — E785 Hyperlipidemia, unspecified: Secondary | ICD-10-CM | POA: Diagnosis not present

## 2023-02-23 DIAGNOSIS — Z1339 Encounter for screening examination for other mental health and behavioral disorders: Secondary | ICD-10-CM | POA: Diagnosis not present

## 2023-02-23 DIAGNOSIS — G729 Myopathy, unspecified: Secondary | ICD-10-CM | POA: Diagnosis not present

## 2023-02-23 DIAGNOSIS — H3530 Unspecified macular degeneration: Secondary | ICD-10-CM | POA: Diagnosis not present

## 2023-02-23 DIAGNOSIS — M810 Age-related osteoporosis without current pathological fracture: Secondary | ICD-10-CM | POA: Diagnosis not present

## 2023-02-23 DIAGNOSIS — I6523 Occlusion and stenosis of bilateral carotid arteries: Secondary | ICD-10-CM | POA: Diagnosis not present

## 2023-03-03 DIAGNOSIS — M9904 Segmental and somatic dysfunction of sacral region: Secondary | ICD-10-CM | POA: Diagnosis not present

## 2023-03-03 DIAGNOSIS — M5413 Radiculopathy, cervicothoracic region: Secondary | ICD-10-CM | POA: Diagnosis not present

## 2023-03-03 DIAGNOSIS — M5137 Other intervertebral disc degeneration, lumbosacral region: Secondary | ICD-10-CM | POA: Diagnosis not present

## 2023-03-03 DIAGNOSIS — M9903 Segmental and somatic dysfunction of lumbar region: Secondary | ICD-10-CM | POA: Diagnosis not present

## 2023-03-03 DIAGNOSIS — M5136 Other intervertebral disc degeneration, lumbar region: Secondary | ICD-10-CM | POA: Diagnosis not present

## 2023-03-03 DIAGNOSIS — M5442 Lumbago with sciatica, left side: Secondary | ICD-10-CM | POA: Diagnosis not present

## 2023-03-03 DIAGNOSIS — M50323 Other cervical disc degeneration at C6-C7 level: Secondary | ICD-10-CM | POA: Diagnosis not present

## 2023-03-03 DIAGNOSIS — M9901 Segmental and somatic dysfunction of cervical region: Secondary | ICD-10-CM | POA: Diagnosis not present

## 2023-05-17 DIAGNOSIS — L578 Other skin changes due to chronic exposure to nonionizing radiation: Secondary | ICD-10-CM | POA: Diagnosis not present

## 2023-05-17 DIAGNOSIS — L821 Other seborrheic keratosis: Secondary | ICD-10-CM | POA: Diagnosis not present

## 2023-05-17 DIAGNOSIS — D2239 Melanocytic nevi of other parts of face: Secondary | ICD-10-CM | POA: Diagnosis not present

## 2023-05-17 DIAGNOSIS — L570 Actinic keratosis: Secondary | ICD-10-CM | POA: Diagnosis not present

## 2023-06-19 DIAGNOSIS — N390 Urinary tract infection, site not specified: Secondary | ICD-10-CM | POA: Diagnosis not present

## 2023-06-19 DIAGNOSIS — N2889 Other specified disorders of kidney and ureter: Secondary | ICD-10-CM | POA: Diagnosis not present

## 2023-06-19 DIAGNOSIS — R300 Dysuria: Secondary | ICD-10-CM | POA: Diagnosis not present

## 2023-10-10 DIAGNOSIS — H353121 Nonexudative age-related macular degeneration, left eye, early dry stage: Secondary | ICD-10-CM | POA: Diagnosis not present

## 2023-10-10 DIAGNOSIS — H25813 Combined forms of age-related cataract, bilateral: Secondary | ICD-10-CM | POA: Diagnosis not present

## 2023-12-19 DIAGNOSIS — L821 Other seborrheic keratosis: Secondary | ICD-10-CM | POA: Diagnosis not present

## 2023-12-19 DIAGNOSIS — L820 Inflamed seborrheic keratosis: Secondary | ICD-10-CM | POA: Diagnosis not present

## 2024-02-29 DIAGNOSIS — E785 Hyperlipidemia, unspecified: Secondary | ICD-10-CM | POA: Diagnosis not present

## 2024-02-29 DIAGNOSIS — E210 Primary hyperparathyroidism: Secondary | ICD-10-CM | POA: Diagnosis not present

## 2024-02-29 DIAGNOSIS — E559 Vitamin D deficiency, unspecified: Secondary | ICD-10-CM | POA: Diagnosis not present

## 2024-02-29 DIAGNOSIS — E890 Postprocedural hypothyroidism: Secondary | ICD-10-CM | POA: Diagnosis not present

## 2024-02-29 DIAGNOSIS — I10 Essential (primary) hypertension: Secondary | ICD-10-CM | POA: Diagnosis not present

## 2024-02-29 DIAGNOSIS — Z1212 Encounter for screening for malignant neoplasm of rectum: Secondary | ICD-10-CM | POA: Diagnosis not present

## 2024-02-29 DIAGNOSIS — M810 Age-related osteoporosis without current pathological fracture: Secondary | ICD-10-CM | POA: Diagnosis not present

## 2024-03-07 DIAGNOSIS — G729 Myopathy, unspecified: Secondary | ICD-10-CM | POA: Diagnosis not present

## 2024-03-07 DIAGNOSIS — I5189 Other ill-defined heart diseases: Secondary | ICD-10-CM | POA: Diagnosis not present

## 2024-03-07 DIAGNOSIS — Z0000 Encounter for general adult medical examination without abnormal findings: Secondary | ICD-10-CM | POA: Diagnosis not present

## 2024-03-07 DIAGNOSIS — G459 Transient cerebral ischemic attack, unspecified: Secondary | ICD-10-CM | POA: Diagnosis not present

## 2024-03-07 DIAGNOSIS — I10 Essential (primary) hypertension: Secondary | ICD-10-CM | POA: Diagnosis not present

## 2024-03-07 DIAGNOSIS — Z8673 Personal history of transient ischemic attack (TIA), and cerebral infarction without residual deficits: Secondary | ICD-10-CM | POA: Diagnosis not present

## 2024-03-07 DIAGNOSIS — M810 Age-related osteoporosis without current pathological fracture: Secondary | ICD-10-CM | POA: Diagnosis not present

## 2024-03-07 DIAGNOSIS — E890 Postprocedural hypothyroidism: Secondary | ICD-10-CM | POA: Diagnosis not present

## 2024-03-07 DIAGNOSIS — E785 Hyperlipidemia, unspecified: Secondary | ICD-10-CM | POA: Diagnosis not present

## 2024-03-07 DIAGNOSIS — Z1331 Encounter for screening for depression: Secondary | ICD-10-CM | POA: Diagnosis not present

## 2024-03-07 DIAGNOSIS — Z1339 Encounter for screening examination for other mental health and behavioral disorders: Secondary | ICD-10-CM | POA: Diagnosis not present

## 2024-03-07 DIAGNOSIS — R82998 Other abnormal findings in urine: Secondary | ICD-10-CM | POA: Diagnosis not present

## 2024-03-07 DIAGNOSIS — I6523 Occlusion and stenosis of bilateral carotid arteries: Secondary | ICD-10-CM | POA: Diagnosis not present
